# Patient Record
Sex: Male | Born: 1990 | Race: Black or African American | Hispanic: No | Marital: Single | Smoking: Current every day smoker
Health system: Southern US, Community
[De-identification: ages and names within clinical notes are randomized; demographics above are authoritative.]

## PROBLEM LIST (undated history)

## (undated) DIAGNOSIS — T401X1A Poisoning by heroin, accidental (unintentional), initial encounter: Secondary | ICD-10-CM

## (undated) DIAGNOSIS — F1910 Other psychoactive substance abuse, uncomplicated: Secondary | ICD-10-CM

---
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
## 2014-03-26 ENCOUNTER — Encounter (HOSPITAL_COMMUNITY): Payer: Self-pay | Admitting: Emergency Medicine

## 2014-03-26 ENCOUNTER — Emergency Department (HOSPITAL_COMMUNITY): Payer: Self-pay

## 2014-03-26 ENCOUNTER — Emergency Department (HOSPITAL_COMMUNITY)
Admission: EM | Admit: 2014-03-26 | Discharge: 2014-03-26 | Disposition: A | Discharge status: Home / Self Care | Payer: Self-pay | Attending: Emergency Medicine | Admitting: Emergency Medicine

## 2014-03-26 DIAGNOSIS — F17200 Nicotine dependence, unspecified, uncomplicated: Secondary | ICD-10-CM | POA: Insufficient documentation

## 2014-03-26 DIAGNOSIS — J209 Acute bronchitis, unspecified: Secondary | ICD-10-CM | POA: Insufficient documentation

## 2014-03-26 DIAGNOSIS — J40 Bronchitis, not specified as acute or chronic: Secondary | ICD-10-CM

## 2014-03-26 DIAGNOSIS — Z792 Long term (current) use of antibiotics: Secondary | ICD-10-CM | POA: Insufficient documentation

## 2014-03-26 LAB — URINE MICROSCOPIC-ADD ON

## 2014-03-26 LAB — URINALYSIS, ROUTINE W REFLEX MICROSCOPIC
Glucose, UA: NEGATIVE mg/dL
Hgb urine dipstick: NEGATIVE
LEUKOCYTES UA: NEGATIVE
NITRITE: NEGATIVE
Protein, ur: 30 mg/dL — AB
SPECIFIC GRAVITY, URINE: 1.025 (ref 1.005–1.030)
Urobilinogen, UA: 1 mg/dL (ref 0.0–1.0)
pH: 7 (ref 5.0–8.0)

## 2014-03-26 LAB — CBC WITH DIFFERENTIAL/PLATELET
BASOS ABS: 0 10*3/uL (ref 0.0–0.1)
BASOS PCT: 0 % (ref 0–1)
EOS PCT: 0 % (ref 0–5)
Eosinophils Absolute: 0 10*3/uL (ref 0.0–0.7)
HEMATOCRIT: 42.1 % (ref 39.0–52.0)
Hemoglobin: 14.5 g/dL (ref 13.0–17.0)
Lymphocytes Relative: 6 % — ABNORMAL LOW (ref 12–46)
Lymphs Abs: 1.2 10*3/uL (ref 0.7–4.0)
MCH: 30.3 pg (ref 26.0–34.0)
MCHC: 34.4 g/dL (ref 30.0–36.0)
MCV: 88.1 fL (ref 78.0–100.0)
Monocytes Absolute: 1.3 10*3/uL — ABNORMAL HIGH (ref 0.1–1.0)
Monocytes Relative: 7 % (ref 3–12)
NEUTROS ABS: 17.2 10*3/uL — AB (ref 1.7–7.7)
Neutrophils Relative %: 87 % — ABNORMAL HIGH (ref 43–77)
Platelets: 156 10*3/uL (ref 150–400)
RBC: 4.78 MIL/uL (ref 4.22–5.81)
RDW: 12.2 % (ref 11.5–15.5)
WBC: 19.8 10*3/uL — ABNORMAL HIGH (ref 4.0–10.5)

## 2014-03-26 LAB — COMPREHENSIVE METABOLIC PANEL
ALT: 13 U/L (ref 0–53)
AST: 21 U/L (ref 0–37)
Albumin: 4.1 g/dL (ref 3.5–5.2)
Alkaline Phosphatase: 65 U/L (ref 39–117)
BUN: 11 mg/dL (ref 6–23)
CALCIUM: 9.7 mg/dL (ref 8.4–10.5)
CHLORIDE: 100 meq/L (ref 96–112)
CO2: 22 mEq/L (ref 19–32)
CREATININE: 1 mg/dL (ref 0.50–1.35)
GFR calc Af Amer: >90 mL/min (ref >90)
GFR calc non Af Amer: >90 mL/min (ref >90)
Glucose, Bld: 113 mg/dL — ABNORMAL HIGH (ref 70–99)
Potassium: 3.7 mEq/L (ref 3.7–5.3)
Sodium: 137 mEq/L (ref 137–147)
TOTAL PROTEIN: 7.9 g/dL (ref 6.0–8.3)
Total Bilirubin: 0.7 mg/dL (ref 0.3–1.2)

## 2014-03-26 LAB — LIPASE, BLOOD: Lipase: 10 U/L — ABNORMAL LOW (ref 11–59)

## 2014-03-26 LAB — RAPID STREP SCREEN (MED CTR MEBANE ONLY): STREPTOCOCCUS, GROUP A SCREEN (DIRECT): NEGATIVE

## 2014-03-26 MED ORDER — AZITHROMYCIN 250 MG PO TABS: ORAL_TABLET | ORAL | Status: AC

## 2014-03-26 MED ORDER — ONDANSETRON HCL 4 MG/2ML IJ SOLN
4.0000 mg | Freq: Once | INTRAMUSCULAR | Status: AC
  Administered 2014-03-26: 4 mg via INTRAVENOUS
  Filled 2014-03-26: qty 2

## 2014-03-26 MED ORDER — PROMETHAZINE HCL 25 MG PO TABS: 25.0000 mg | ORAL_TABLET | Freq: Four times a day (QID) | ORAL | Status: AC | PRN

## 2014-03-26 MED ORDER — KETOROLAC TROMETHAMINE 30 MG/ML IJ SOLN
30.0000 mg | Freq: Once | INTRAMUSCULAR | Status: AC
  Administered 2014-03-26: 30 mg via INTRAVENOUS
  Filled 2014-03-26: qty 1

## 2014-03-26 MED ORDER — SODIUM CHLORIDE 0.9 % IV BOLUS (SEPSIS)
1000.0000 mL | Freq: Once | INTRAVENOUS | Status: AC
  Administered 2014-03-26: 1000 mL via INTRAVENOUS

## 2014-03-26 MED ORDER — IBUPROFEN 800 MG PO TABS: 800.0000 mg | ORAL_TABLET | Freq: Three times a day (TID) | ORAL | Status: AC | PRN

## 2014-03-26 NOTE — ED Notes (Signed)
Pt provided with warm blankets.

## 2014-03-26 NOTE — ED Provider Notes (Signed)
CSN: [id number]     Arrival date & time 03/26/14  1317 History   First MD Initiated Contact with Patient 03/26/14 1459     Chief Complaint  Patient presents with  . Nausea  . Emesis     (Consider location/radiation/quality/duration/timing/severity/associated sxs/prior Treatment) Patient is a 23 y.o. male presenting with vomiting. The history is provided by the patient (the pt complains of cogh and vomiting).  Emesis Severity:  Moderate Timing:  Intermittent Able to tolerate:  Liquids Progression:  Improving Chronicity:  New Context: post-tussive   Relieved by:  Nothing Associated symptoms: no abdominal pain, no diarrhea and no headaches     History reviewed. No pertinent past medical history. History reviewed. No pertinent past surgical history. History reviewed. No pertinent family history. History  Substance Use Topics  . Smoking status: Current Every Day Smoker -- 1.00 packs/day    Types: Cigarettes  . Smokeless tobacco: Not on file  . Alcohol Use: No    Review of Systems  Constitutional: Negative for appetite change and fatigue.  HENT: Negative for congestion, ear discharge and sinus pressure.   Eyes: Negative for discharge.  Respiratory: Positive for cough.   Cardiovascular: Negative for chest pain.  Gastrointestinal: Positive for vomiting. Negative for abdominal pain and diarrhea.  Genitourinary: Negative for frequency and hematuria.  Musculoskeletal: Negative for back pain.  Skin: Negative for rash.  Neurological: Negative for seizures and headaches.  Psychiatric/Behavioral: Negative for hallucinations.      Allergies  Aspirin  Home Medications   Current Outpatient Rx  Name  Route  Sig  Dispense  Refill  . OVER THE COUNTER MEDICATION   Oral   Take 2 tablets by mouth every 4 (four) hours as needed (cold symptoms). OTC cold and flu medication.         Marland Kitchen. azithromycin (ZITHROMAX Z-PAK) 250 MG tablet      2 po day one, then 1 daily x 4 days   5  tablet   0   . ibuprofen (ADVIL,MOTRIN) 800 MG tablet   Oral   Take 1 tablet (800 mg total) by mouth every 8 (eight) hours as needed for fever or mild pain.   15 tablet   0   . promethazine (PHENERGAN) 25 MG tablet   Oral   Take 1 tablet (25 mg total) by mouth every 6 (six) hours as needed for nausea or vomiting.   15 tablet   0    BP 116/75  Pulse 94  Temp(Src) 98.7 F (37.1 C) (Rectal)  Resp 17  Wt 200 lb (90.719 kg)  SpO2 98% Physical Exam  Constitutional: He is oriented to person, place, and time. He appears well-developed.  HENT:  Head: Normocephalic.  Eyes: Conjunctivae and EOM are normal. No scleral icterus.  Neck: Neck supple. No thyromegaly present.  Cardiovascular: Normal rate and regular rhythm.  Exam reveals no gallop and no friction rub.   No murmur heard. Pulmonary/Chest: No stridor. He has no wheezes. He has no rales. He exhibits no tenderness.  Abdominal: He exhibits no distension. There is no tenderness. There is no rebound.  Musculoskeletal: Normal range of motion. He exhibits no edema.  Lymphadenopathy:    He has no cervical adenopathy.  Neurological: He is oriented to person, place, and time. He exhibits normal muscle tone. Coordination normal.  Skin: No rash noted. No erythema.  Psychiatric: He has a normal mood and affect. His behavior is normal.    ED Course  Procedures (including critical care time)  Labs Review Labs Reviewed  CBC WITH DIFFERENTIAL - Abnormal; Notable for the following:    WBC 19.8 (*)    Neutrophils Relative % 87 (*)    Neutro Abs 17.2 (*)    Lymphocytes Relative 6 (*)    Monocytes Absolute 1.3 (*)    All other components within normal limits  COMPREHENSIVE METABOLIC PANEL - Abnormal; Notable for the following:    Glucose, Bld 113 (*)    All other components within normal limits  LIPASE, BLOOD - Abnormal; Notable for the following:    Lipase 10 (*)    All other components within normal limits  URINALYSIS, ROUTINE W  REFLEX MICROSCOPIC - Abnormal; Notable for the following:    Bilirubin Urine SMALL (*)    Ketones, ur TRACE (*)    Protein, ur 30 (*)    All other components within normal limits  RAPID STREP SCREEN  CULTURE, GROUP A STREP  URINE MICROSCOPIC-ADD ON   Imaging Review Dg Chest 2 View  03/26/2014   CLINICAL DATA:  Fever and chills, history smoking  EXAM: CHEST  2 VIEW  COMPARISON:  None  FINDINGS: Upper-normal size of cardiac silhouette.  Mediastinal contours and pulmonary vascularity normal.  Lungs clear.  Mild central peribronchial thickening.  No pleural effusion or pneumothorax.  Bones unremarkable.  IMPRESSION: Bronchitic changes without infiltrate.   Electronically Signed   By: Ulyses Southward M.D.   On: 03/26/2014 18:27     EKG Interpretation None      MDM   Final diagnoses:  Bronchitis        Benny Lennert, MD 03/26/14 [phone]

## 2014-03-26 NOTE — ED Notes (Signed)
Pt arrives with c/o of flu like symptoms, nausea, vomiting, diarrhea, hot to the touch for the last day. Attempted to use tylenol this morning but vomiting it back up.

## 2014-03-26 NOTE — ED Notes (Signed)
Patient transported to X-ray 

## 2014-03-26 NOTE — Discharge Instructions (Signed)
Drink plenty of fluids. And follow up in 2-3 days if not improving.

## 2014-03-26 NOTE — ED Notes (Signed)
Pt requesting water and food.  NAD noted.

## 2014-03-26 NOTE — ED Notes (Signed)
Family at bedside. Called to room. Patient states that he just vomited and he has not eaten all day.

## 2014-03-28 LAB — CULTURE, GROUP A STREP

## 2019-10-27 ENCOUNTER — Other Ambulatory Visit: Payer: Self-pay

## 2019-10-27 ENCOUNTER — Emergency Department (HOSPITAL_COMMUNITY): Admission: EM | Admit: 2019-10-27 | Discharge: 2019-10-27 | Discharge status: Against Medical Advice | Payer: Self-pay

## 2020-04-11 ENCOUNTER — Other Ambulatory Visit: Payer: Self-pay

## 2020-04-11 ENCOUNTER — Ambulatory Visit: Payer: Self-pay | Attending: Internal Medicine

## 2020-04-11 DIAGNOSIS — Z20822 Contact with and (suspected) exposure to covid-19: Secondary | ICD-10-CM | POA: Insufficient documentation

## 2020-04-12 LAB — NOVEL CORONAVIRUS, NAA: SARS-CoV-2, NAA: NOT DETECTED

## 2020-04-12 LAB — SARS-COV-2, NAA 2 DAY TAT

## 2021-03-05 ENCOUNTER — Encounter (HOSPITAL_COMMUNITY): Payer: Self-pay

## 2021-03-05 ENCOUNTER — Emergency Department (HOSPITAL_COMMUNITY)
Admission: EM | Admit: 2021-03-05 | Discharge: 2021-03-05 | Disposition: A | Discharge status: Home / Self Care | Payer: Self-pay | Attending: Emergency Medicine | Admitting: Emergency Medicine

## 2021-03-05 ENCOUNTER — Other Ambulatory Visit: Payer: Self-pay

## 2021-03-05 DIAGNOSIS — F17210 Nicotine dependence, cigarettes, uncomplicated: Secondary | ICD-10-CM | POA: Insufficient documentation

## 2021-03-05 DIAGNOSIS — R030 Elevated blood-pressure reading, without diagnosis of hypertension: Secondary | ICD-10-CM | POA: Insufficient documentation

## 2021-03-05 NOTE — Discharge Instructions (Addendum)
Please read the attachments.  You have an elevated blood pressure reading here in the ED.  However, we need to first connect lifestyle modifications to see if we can improve your blood pressure without medications.  I recommend regular exercise 4-5 times per week, low-salt diet, and most importantly, tobacco cessation.    It is vitally important to get established with a primary care provider as soon as possible for ongoing evaluation and management of your health and wellbeing.  It is important to have continuity of care with a single provider.  Please return to the ED or seek immediate medical attention should you experience any new or worsening symptoms.

## 2021-03-05 NOTE — ED Provider Notes (Signed)
[facility] EMERGENCY DEPARTMENT Provider Note   CSN: [id number] Arrival date & time: 03/05/21  1554     History Chief Complaint  Patient presents with  . Hypertension    Eddie Carrillo is a 30 y.o. male with no relevant past medical history who comes the ED with complaints of high blood pressures.    On my examination, patient reports that this morning he woke up and felt as though he had some mildly blurred vision/saw spots for approximately 10 minutes before it spontaneously resolved.  He states that he eats mustard which made it better.  His wife states that she suspected to be related to his blood pressure.  He then went to his place of employment and inform them of his symptoms.  They also advised him to come the ED for evaluation with concern for high blood pressure.  He is not endorsing any symptoms at this time.  His visual acuity is at baseline.  Denies any chest pain, shortness of breath, headache, back pain, nausea or emesis, or other symptoms.  Patient states that he has recently been released from being incarcerated and has not yet established care with a primary care provider.  However, he suspects that he may be able to obtain medical insurance through his employer.    Patient admits to smoking 1 pack of menthol cigarettes daily x1 month.  Previously he had been smoking Black & Mild cigars.    HPI     History reviewed. No pertinent past medical history.  There are no problems to display for this patient.   History reviewed. No pertinent surgical history.     History reviewed. No pertinent family history.  Social History   Tobacco Use  . Smoking status: Current Every Day Smoker    Packs/day: 1.00    Types: Cigarettes  . Smokeless tobacco: Never Used  Vaping Use  . Vaping Use: Never used  Substance Use Topics  . Alcohol use: Yes  . Drug use: Not Currently    Types: Marijuana    Home Medications Prior to Admission medications   Medication Sig  Start Date End Date Taking? Authorizing Provider  azithromycin (ZITHROMAX Z-PAK) 250 MG tablet 2 po day one, then 1 daily x 4 days 03/26/14   Bethann Berkshire, MD  ibuprofen (ADVIL,MOTRIN) 800 MG tablet Take 1 tablet (800 mg total) by mouth every 8 (eight) hours as needed for fever or mild pain. 03/26/14   Bethann Berkshire, MD  OVER THE COUNTER MEDICATION Take 2 tablets by mouth every 4 (four) hours as needed (cold symptoms). OTC cold and flu medication.    [provider]  promethazine (PHENERGAN) 25 MG tablet Take 1 tablet (25 mg total) by mouth every 6 (six) hours as needed for nausea or vomiting. 03/26/14   Bethann Berkshire, MD    Allergies    Aspirin  Review of Systems   Review of Systems  All other systems reviewed and are negative.   Physical Exam Updated Vital Signs BP (!) 156/95 (BP Location: Right Arm)   Pulse 80   Temp 98.4 F (36.9 C) (Oral)   Resp 18   Ht 5\' 10"  (1.778 m)   Wt 90.7 kg   SpO2 99%   BMI 28.70 kg/m   Physical Exam Vitals and nursing note reviewed. Exam conducted with a chaperone present.  Constitutional:      General: He is not in acute distress.    Appearance: Normal appearance. He is not ill-appearing or toxic-appearing.  HENT:     Head: Normocephalic and atraumatic.  Eyes:     General: No scleral icterus.    Extraocular Movements: Extraocular movements intact.     Conjunctiva/sclera: Conjunctivae normal.     Pupils: Pupils are equal, round, and reactive to light.  Cardiovascular:     Rate and Rhythm: Normal rate and regular rhythm.     Pulses: Normal pulses.     Heart sounds: Normal heart sounds.  Pulmonary:     Effort: Pulmonary effort is normal. No respiratory distress.     Breath sounds: Normal breath sounds. No wheezing or rales.  Musculoskeletal:        General: Normal range of motion.     Cervical back: Normal range of motion.  Skin:    General: Skin is dry.  Neurological:     General: No focal deficit present.     Mental Status:  He is alert and oriented to person, place, and time.     GCS: GCS eye subscore is 4. GCS verbal subscore is 5. GCS motor subscore is 6.     Cranial Nerves: No cranial nerve deficit.  Psychiatric:        Mood and Affect: Mood normal.        Behavior: Behavior normal.        Thought Content: Thought content normal.     ED Results / Procedures / Treatments   Labs (all labs ordered are listed, but only abnormal results are displayed) Labs Reviewed - No data to display  EKG None  Radiology No results found.  Procedures Procedures   Medications Ordered in ED Medications - No data to display  ED Course  I have reviewed the triage vital signs and the nursing notes.  Pertinent labs & imaging results that were available during my care of the patient were reviewed by me and considered in my medical decision making (see chart for details).    MDM Rules/Calculators/A&P                          Eddie Carrillo was evaluated in Emergency Department on 03/05/2021 for the symptoms described in the history of present illness. He was evaluated in the context of the global COVID-19 pandemic, which necessitated consideration that the patient might be at risk for infection with the SARS-CoV-2 virus that causes COVID-19. Institutional protocols and algorithms that pertain to the evaluation of patients at risk for COVID-19 are in a state of rapid change based on information released by regulatory bodies including the CDC and federal and state organizations. These policies and algorithms were followed during the patient's care in the ED.  I personally reviewed patient's medical chart and all notes from triage and staff during today's encounter. I have also ordered and reviewed all labs and imaging that I felt to be medically necessary in the evaluation of this patient's complaints and with consideration of their physical exam. If needed, translation services were available and utilized.   Patient  with mildly elevated blood pressure here in the ED at 156/95.  He states that he was minimally symptomatic with visual changes earlier this morning, but that it subsided.  No symptoms at present.  He is requesting work note.    Emphasized the importance of close outpatient follow-up given his elevated blood pressure.  I cautioned him on the long term consequences from untreated hypertension.    While patient has elevated BP here in the ED, I  have lower suspicion for encephalopathy, stroke, aortic dissection or ACS, CVA, renal failure, or eclampsia.  Patient's physical exam is benign and I do not feel as though further work-up is warranted.  Low suspicion for endorgan injury.  We will encourage regular exercise 4-5 times per week and healthy low-salt diet.  I also strongly advised that he discontinue smoking tobacco.  ED return precautions discussed.  Patient voices understanding is agreeable to the plan.  The patient was counseled on the dangers of tobacco use, and was advised to quit.  Reviewed strategies to maximize success, including removing cigarettes and smoking materials from environment, stress management, substitution of other forms of reinforcement, support of family/friends and written materials. Total time was 5 min CPT code 62229.    Final Clinical Impression(s) / ED Diagnoses Final diagnoses:  Blood pressure elevated without history of HTN    Rx / DC Orders ED Discharge Orders    None       Lorelee New, PA-C 03/05/21 1643    Vanetta Mulders, MD 03/12/21 [phone]

## 2021-03-05 NOTE — ED Triage Notes (Signed)
Pt to er, pt states that he is here for htn, states that he has seen his md before and told that he was pre hypertensive, states that he is not currently on any medications for his blood pressure, states that he is here today because he started seeing spots in his vision and was told that this ment his blood pressure was high. Denies chest pain, denies weakness, denies confusion

## 2021-03-17 ENCOUNTER — Other Ambulatory Visit: Payer: Self-pay | Admitting: Nurse Practitioner

## 2021-04-02 ENCOUNTER — Ambulatory Visit: Payer: Self-pay | Admitting: Nurse Practitioner

## 2021-06-04 ENCOUNTER — Emergency Department (HOSPITAL_COMMUNITY)
Admission: EM | Admit: 2021-06-04 | Discharge: 2021-06-04 | Disposition: A | Discharge status: Home / Self Care | Payer: Self-pay | Attending: Emergency Medicine | Admitting: Emergency Medicine

## 2021-06-04 ENCOUNTER — Encounter (HOSPITAL_COMMUNITY): Payer: Self-pay | Admitting: Pharmacy Technician

## 2021-06-04 DIAGNOSIS — R1084 Generalized abdominal pain: Secondary | ICD-10-CM | POA: Insufficient documentation

## 2021-06-04 DIAGNOSIS — R1110 Vomiting, unspecified: Secondary | ICD-10-CM | POA: Insufficient documentation

## 2021-06-04 DIAGNOSIS — F17210 Nicotine dependence, cigarettes, uncomplicated: Secondary | ICD-10-CM | POA: Insufficient documentation

## 2021-06-04 LAB — COMPREHENSIVE METABOLIC PANEL
ALT: 17 U/L (ref 0–44)
AST: 28 U/L (ref 15–41)
Albumin: 4.5 g/dL (ref 3.5–5.0)
Alkaline Phosphatase: 50 U/L (ref 38–126)
Anion gap: 15 (ref 5–15)
BUN: 11 mg/dL (ref 6–20)
CO2: 19 mmol/L — ABNORMAL LOW (ref 22–32)
Calcium: 9.7 mg/dL (ref 8.9–10.3)
Chloride: 104 mmol/L (ref 98–111)
Creatinine, Ser: 1.2 mg/dL (ref 0.61–1.24)
GFR, Estimated: >60 mL/min (ref >60)
Glucose, Bld: 127 mg/dL — ABNORMAL HIGH (ref 70–99)
Potassium: 3.3 mmol/L — ABNORMAL LOW (ref 3.5–5.1)
Sodium: 138 mmol/L (ref 135–145)
Total Bilirubin: 1 mg/dL (ref 0.3–1.2)
Total Protein: 7.5 g/dL (ref 6.5–8.1)

## 2021-06-04 LAB — LIPASE, BLOOD: Lipase: 23 U/L (ref 11–51)

## 2021-06-04 LAB — CBC WITH DIFFERENTIAL/PLATELET
Abs Immature Granulocytes: 0.06 10*3/uL (ref 0.00–0.07)
Basophils Absolute: 0 10*3/uL (ref 0.0–0.1)
Basophils Relative: 0 %
Eosinophils Absolute: 0 10*3/uL (ref 0.0–0.5)
Eosinophils Relative: 0 %
HCT: 46.3 % (ref 39.0–52.0)
Hemoglobin: 16.1 g/dL (ref 13.0–17.0)
Immature Granulocytes: 0 %
Lymphocytes Relative: 13 %
Lymphs Abs: 1.8 10*3/uL (ref 0.7–4.0)
MCH: 31.3 pg (ref 26.0–34.0)
MCHC: 34.8 g/dL (ref 30.0–36.0)
MCV: 90.1 fL (ref 80.0–100.0)
Monocytes Absolute: 0.6 10*3/uL (ref 0.1–1.0)
Monocytes Relative: 4 %
Neutro Abs: 11 10*3/uL — ABNORMAL HIGH (ref 1.7–7.7)
Neutrophils Relative %: 83 %
Platelets: 206 10*3/uL (ref 150–400)
RBC: 5.14 MIL/uL (ref 4.22–5.81)
RDW: 12.5 % (ref 11.5–15.5)
WBC: 13.5 10*3/uL — ABNORMAL HIGH (ref 4.0–10.5)
nRBC: 0 % (ref 0.0–0.2)

## 2021-06-04 LAB — URINALYSIS, ROUTINE W REFLEX MICROSCOPIC
Bilirubin Urine: NEGATIVE
Glucose, UA: NEGATIVE mg/dL
Hgb urine dipstick: NEGATIVE
Ketones, ur: 5 mg/dL — AB
Nitrite: NEGATIVE
Protein, ur: 100 mg/dL — AB
Specific Gravity, Urine: 1.036 — ABNORMAL HIGH (ref 1.005–1.030)
pH: 6 (ref 5.0–8.0)

## 2021-06-04 MED ORDER — METOCLOPRAMIDE HCL 10 MG PO TABS: 10.0000 mg | ORAL_TABLET | Freq: Three times a day (TID) | ORAL | 0 refills | Status: DC

## 2021-06-04 MED ORDER — CAPSAICIN 0.025 % EX CREA
TOPICAL_CREAM | Freq: Once | CUTANEOUS | Status: DC
  Filled 2021-06-04: qty 60

## 2021-06-04 MED ORDER — METOCLOPRAMIDE HCL 10 MG PO TABS
10.0000 mg | ORAL_TABLET | Freq: Three times a day (TID) | ORAL | 0 refills | Status: AC
Start: 2021-06-04 — End: 2022-06-04

## 2021-06-04 MED ORDER — HALOPERIDOL LACTATE 5 MG/ML IJ SOLN
2.0000 mg | Freq: Once | INTRAMUSCULAR | Status: AC
  Administered 2021-06-04: 2 mg via INTRAVENOUS
  Filled 2021-06-04: qty 1

## 2021-06-04 MED ORDER — SODIUM CHLORIDE 0.9 % IV BOLUS
1000.0000 mL | Freq: Once | INTRAVENOUS | Status: AC
  Administered 2021-06-04: 1000 mL via INTRAVENOUS

## 2021-06-04 MED ORDER — ONDANSETRON 4 MG PO TBDP
8.0000 mg | ORAL_TABLET | Freq: Once | ORAL | Status: AC
  Administered 2021-06-04: 8 mg via ORAL
  Filled 2021-06-04: qty 2

## 2021-06-04 NOTE — ED Provider Notes (Signed)
[facility] EMERGENCY DEPARTMENT Provider Note   CSN: [id number] Arrival date & time: 06/04/21  1109     History Chief Complaint  Patient presents with   Abdominal Pain   Emesis    Eddie Carrillo is a 30 y.o. male.  The history is provided by the patient. No language interpreter was used.  Abdominal Pain Pain location:  Generalized Pain quality: aching   Pain radiates to:  Does not radiate Pain severity:  Moderate Onset quality:  Gradual Timing:  Constant Progression:  Worsening Chronicity:  New Context comment:  Marijuana use Relieved by:  Nothing Worsened by:  Nothing Ineffective treatments:  None tried Associated symptoms: vomiting   Emesis Severity:  Moderate Timing:  Constant Progression:  Worsening Recent urination:  Normal Relieved by:  Nothing Worsened by:  Nothing Associated symptoms: abdominal pain   Risk factors: no suspect food intake       History reviewed. No pertinent past medical history.  There are no problems to display for this patient.   History reviewed. No pertinent surgical history.     No family history on file.  Social History   Tobacco Use   Smoking status: Every Day    Packs/day: 1.00    Pack years: 0.00    Types: Cigarettes   Smokeless tobacco: Never  Vaping Use   Vaping Use: Never used  Substance Use Topics   Alcohol use: Yes   Drug use: Not Currently    Types: Marijuana    Home Medications Prior to Admission medications   Medication Sig Start Date End Date Taking? Authorizing Provider  azithromycin (ZITHROMAX Z-PAK) 250 MG tablet 2 po day one, then 1 daily x 4 days 03/26/14   Bethann Berkshire, MD  ibuprofen (ADVIL,MOTRIN) 800 MG tablet Take 1 tablet (800 mg total) by mouth every 8 (eight) hours as needed for fever or mild pain. 03/26/14   Bethann Berkshire, MD  OVER THE COUNTER MEDICATION Take 2 tablets by mouth every 4 (four) hours as needed (cold symptoms). OTC cold and flu medication.     [provider]  promethazine (PHENERGAN) 25 MG tablet Take 1 tablet (25 mg total) by mouth every 6 (six) hours as needed for nausea or vomiting. 03/26/14   Bethann Berkshire, MD    Allergies    Aspirin  Review of Systems   Review of Systems  Gastrointestinal:  Positive for abdominal pain and vomiting.  All other systems reviewed and are negative.  Physical Exam Updated Vital Signs BP (!) 146/113 (BP Location: Left Arm)   Pulse 63   Temp 98.2 F (36.8 C) (Oral)   Resp 18   SpO2 100%   Physical Exam Vitals and nursing note reviewed.  Constitutional:      Appearance: He is well-developed.  HENT:     Head: Normocephalic and atraumatic.  Eyes:     Conjunctiva/sclera: Conjunctivae normal.  Cardiovascular:     Rate and Rhythm: Normal rate and regular rhythm.     Heart sounds: No murmur heard. Pulmonary:     Effort: Pulmonary effort is normal. No respiratory distress.     Breath sounds: Normal breath sounds.  Abdominal:     Palpations: Abdomen is soft.     Tenderness: There is no abdominal tenderness.  Musculoskeletal:     Cervical back: Neck supple.  Skin:    General: Skin is warm and dry.  Neurological:     Mental Status: He is alert.    ED Results /  Procedures / Treatments   Labs (all labs ordered are listed, but only abnormal results are displayed) Labs Reviewed  CBC WITH DIFFERENTIAL/PLATELET - Abnormal; Notable for the following components:      Result Value   WBC 13.5 (*)    Neutro Abs 11.0 (*)    All other components within normal limits  COMPREHENSIVE METABOLIC PANEL - Abnormal; Notable for the following components:   Potassium 3.3 (*)    CO2 19 (*)    Glucose, Bld 127 (*)    All other components within normal limits  URINALYSIS, ROUTINE W REFLEX MICROSCOPIC - Abnormal; Notable for the following components:   Color, Urine AMBER (*)    APPearance CLOUDY (*)    Specific Gravity, Urine 1.036 (*)    Ketones, ur 5 (*)    Protein, ur 100 (*)     Leukocytes,Ua SMALL (*)    Bacteria, UA MANY (*)    All other components within normal limits  LIPASE, BLOOD  RAPID URINE DRUG SCREEN, HOSP PERFORMED    EKG None  Radiology No results found.  Procedures Procedures   Medications Ordered in ED Medications  capsaicin (ZOSTRIX) 0.025 % cream (has no administration in time range)  ondansetron (ZOFRAN-ODT) disintegrating tablet 8 mg (8 mg Oral Given 06/04/21 1142)  haloperidol lactate (HALDOL) injection 2 mg (2 mg Intravenous Given 06/04/21 1615)  sodium chloride 0.9 % bolus 1,000 mL (1,000 mLs Intravenous New Bag/Given 06/04/21 1616)    ED Course  I have reviewed the triage vital signs and the nursing notes.  Pertinent labs & imaging results that were available during my care of the patient were reviewed by me and considered in my medical decision making (see chart for details).    MDM Rules/Calculators/A&P                          MDM:  Pt given Iv fluids x 1 liter.  Haldol 2mg  IV.  Pt reports relief from symptoms.  Pt advised to avoid THC use.   Final Clinical Impression(s) / ED Diagnoses Final diagnoses:  Non-intractable vomiting, presence of nausea not specified, unspecified vomiting type    Rx / DC Orders ED Discharge Orders     None     An After Visit Summary was printed and given to the patient.    , Elson Areas 06/04/21 1752    06/06/21, MD 06/05/21 1539

## 2021-06-04 NOTE — ED Triage Notes (Signed)
Pt here with emesis, abd pain since late yesterday. Pt does endorse marijuana use yesterday. Pt also c/o some shob today. Speaking in complete sentences.

## 2021-06-04 NOTE — ED Provider Notes (Signed)
Emergency Medicine Provider Triage Evaluation Note  Eddie Carrillo , a 30 y.o. male  was evaluated in triage.  Pt complains of abdominal pain and shortness of breath with vomiting onset this morning.  Denies alcohol use, does report smoking marijuana.  Denies constipation or diarrhea.  Reports 7 episodes of nonbloody emesis and feels severely dehydrated..  Review of Systems  Positive: Abdominal pain, vomiting, shortness of breath Negative: Chest pain, diarrhea  Physical Exam  BP (!) 162/100 (BP Location: Left Arm)   Pulse 98   Temp 98.2 F (36.8 C) (Oral)   Resp 18   SpO2 100%  Gen:   Awake, no distress   Resp:  Normal effort  MSK:   Moves extremities without difficulty  Other:  Lungs CTA, very anxious  Medical Decision Making  Medically screening exam initiated at 11:37 AM.  Appropriate orders placed.  Eddie Carrillo was informed that the remainder of the evaluation will be completed by another provider, this initial triage assessment does not replace that evaluation, and the importance of remaining in the ED until their evaluation is complete.     Jeannie Fend, PA-C 06/04/21 1138    Arby Barrette, MD 06/15/21 1239

## 2021-11-02 ENCOUNTER — Encounter (HOSPITAL_COMMUNITY): Payer: Self-pay | Admitting: Emergency Medicine

## 2021-11-02 ENCOUNTER — Emergency Department (HOSPITAL_COMMUNITY)
Admission: EM | Admit: 2021-11-02 | Discharge: 2021-11-03 | Disposition: A | Discharge status: Home / Self Care | Payer: Self-pay | Attending: Emergency Medicine | Admitting: Emergency Medicine

## 2021-11-02 ENCOUNTER — Emergency Department (HOSPITAL_COMMUNITY): Payer: Self-pay

## 2021-11-02 DIAGNOSIS — T50901A Poisoning by unspecified drugs, medicaments and biological substances, accidental (unintentional), initial encounter: Secondary | ICD-10-CM | POA: Insufficient documentation

## 2021-11-02 DIAGNOSIS — R109 Unspecified abdominal pain: Secondary | ICD-10-CM | POA: Insufficient documentation

## 2021-11-02 DIAGNOSIS — R112 Nausea with vomiting, unspecified: Secondary | ICD-10-CM

## 2021-11-02 DIAGNOSIS — F17210 Nicotine dependence, cigarettes, uncomplicated: Secondary | ICD-10-CM | POA: Insufficient documentation

## 2021-11-02 DIAGNOSIS — R52 Pain, unspecified: Secondary | ICD-10-CM

## 2021-11-02 DIAGNOSIS — X58XXXA Exposure to other specified factors, initial encounter: Secondary | ICD-10-CM | POA: Insufficient documentation

## 2021-11-02 DIAGNOSIS — R0689 Other abnormalities of breathing: Secondary | ICD-10-CM | POA: Insufficient documentation

## 2021-11-02 LAB — CBC
HCT: 46.1 % (ref 39.0–52.0)
Hemoglobin: 15.8 g/dL (ref 13.0–17.0)
MCH: 30.9 pg (ref 26.0–34.0)
MCHC: 34.3 g/dL (ref 30.0–36.0)
MCV: 90 fL (ref 80.0–100.0)
Platelets: 165 10*3/uL (ref 150–400)
RBC: 5.12 MIL/uL (ref 4.22–5.81)
RDW: 12.4 % (ref 11.5–15.5)
WBC: 22.7 10*3/uL — ABNORMAL HIGH (ref 4.0–10.5)
nRBC: 0 % (ref 0.0–0.2)

## 2021-11-02 MED ORDER — SODIUM CHLORIDE 0.9 % IV BOLUS
1000.0000 mL | Freq: Once | INTRAVENOUS | Status: AC
  Administered 2021-11-02: 1000 mL via INTRAVENOUS

## 2021-11-02 MED ORDER — LORAZEPAM 2 MG/ML IJ SOLN: 1.0000 mg | Freq: Once | INTRAMUSCULAR | Status: AC

## 2021-11-02 MED ORDER — LORAZEPAM 2 MG/ML IJ SOLN
INTRAMUSCULAR | Status: AC
  Administered 2021-11-02: 1 mg via INTRAVENOUS
  Filled 2021-11-02: qty 1
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
## 2021-11-02 NOTE — ED Provider Notes (Signed)
AP-EMERGENCY DEPT [facility] Emergency Department Provider Note MRN:  [id number]  Arrival date & time: 11/03/21     Chief Complaint   Drug Overdose   History of Present Illness   Eddie Carrillo is a 30 y.o. year-old male with a history of hypertension presenting to the ED with chief complaint of drug overdose.  Patient suspects that he was given drugs, explaining that he thinks some people put some drugs in his drink.  He is convinced that he was given heroin and Suboxone.  He is very anxious, mildly altered.  Complaining of pain all over.  Symptoms started about 4 hours ago shortly after drinking this beverage.  I was unable to obtain an accurate HPI, PMH, or ROS due to the patient's altered mental status.  Level 5 caveat.  Review of Systems  Positive for total body pain.  Patient's Health History   History reviewed. No pertinent past medical history.  History reviewed. No pertinent surgical history.  History reviewed. No pertinent family history.  Social History   Socioeconomic History   Marital status: Single    Spouse name: Not on file   Number of children: Not on file   Years of education: Not on file   Highest education level: Not on file  Occupational History   Not on file  Tobacco Use   Smoking status: Every Day    Packs/day: 1.00    Types: Cigarettes   Smokeless tobacco: Never  Vaping Use   Vaping Use: Never used  Substance and Sexual Activity   Alcohol use: Yes   Drug use: Not Currently    Types: Marijuana   Sexual activity: Not on file  Other Topics Concern   Not on file  Social History Narrative   Not on file   Social Determinants of Health   Financial Resource Strain: Not on file  Food Insecurity: Not on file  Transportation Needs: Not on file  Physical Activity: Not on file  Stress: Not on file  Social Connections: Not on file  Intimate Partner Violence: Not on file     Physical Exam   Vitals:   11/03/21 0242 11/03/21 0340   BP: (!) 179/98   Pulse: 87 93  Resp: 16 16  Temp:    SpO2: 96% 96%    CONSTITUTIONAL: Anxious appearing NEURO:  Alert and oriented x 3, no focal deficits, mildly confused EYES:  eyes equal and reactive ENT/NECK:  no LAD, no JVD CARDIO: Tachycardic rate, well-perfused, normal S1 and S2 PULM:  CTAB no wheezing or rhonchi, tachypneic GI/GU:  normal bowel sounds, non-distended, non-tender MSK/SPINE:  No gross deformities, no edema SKIN:  no rash, atraumatic PSYCH: Unable to assess  *Additional and/or pertinent findings included in MDM below  Diagnostic and Interventional Summary    EKG Interpretation  Date/Time:  Sunday November 02 2021 23:40:45 EST Ventricular Rate:  133 PR Interval:  140 QRS Duration: 79 QT Interval:  301 QTC Calculation: 448 R Axis:   -17 Text Interpretation: Sinus tachycardia Ventricular premature complex Aberrant conduction of SV complex(es) Probable left atrial enlargement Borderline left axis deviation Confirmed by Kennis Carina [phone]) on 11/03/2021 12:20:37 AM       Labs Reviewed  CBC - Abnormal; Notable for the following components:      Result Value   WBC 22.7 (*)    All other components within normal limits  COMPREHENSIVE METABOLIC PANEL - Abnormal; Notable for the following components:   Potassium 3.3 (*)    CO2  21 (*)    Glucose, Bld 197 (*)    All other components within normal limits  ACETAMINOPHEN LEVEL - Abnormal; Notable for the following components:   Acetaminophen (Tylenol), Serum <10 (*)    All other components within normal limits  SALICYLATE LEVEL - Abnormal; Notable for the following components:   Salicylate Lvl <7.0 (*)    All other components within normal limits  LIPASE, BLOOD  ETHANOL  RAPID URINE DRUG SCREEN, HOSP PERFORMED  TROPONIN I (HIGH SENSITIVITY)    DG Chest Port 1 View  Final Result    CT HEAD WO CONTRAST ( )    (Results Pending)  CT Angio Chest/Abd/Pel for Dissection W and/or Wo Contrast    (Results  Pending)    Medications  sodium chloride 0.9 % bolus 1,000 mL (0 mLs Intravenous Stopped 11/03/21 0349)  LORazepam (ATIVAN) injection 1 mg (1 mg Intravenous Given 11/02/21 2350)  ondansetron (ZOFRAN) injection 4 mg (4 mg Intravenous Given 11/03/21 0410)  LORazepam (ATIVAN) injection 1 mg (1 mg Intravenous Given 11/03/21 6294)     Procedures  /  Critical Care Procedures  ED Course and Medical Decision Making  I have reviewed the triage vital signs, the nursing notes, and pertinent available records from the EMR.  Listed above are laboratory and imaging tests that I personally ordered, reviewed, and interpreted and then considered in my medical decision making (see below for details).  Patient arrives concerned about drug ingestion.  Denies any intentional use of drugs.  Suspects he was given opioids, however his toxidrome seems more sympathomimetic.  He is tachycardic, tachypneic, seems very anxious, pupils 4 mm.  Providing fluids, IV Ativan and will monitor closely.  He is complaining of total body pain, including chest pain, EKG with sinus tachycardia, no obvious ischemic findings.  Awaiting labs, troponin.     Laboratory evaluation is reassuring, chest x-ray normal.  Vital signs have largely normalized however patient continues to experience pain, loud violent retching.  Continues to complain of pain and feeling like he is going to die.  Unable to control symptoms with Ativan thus far.  Given the concern for possible sympathomimetic toxidrome and the presence of chest/abdominal pain, will obtain CT imaging to exclude aortic dissection, CT scan to exclude intracranial mass or signs of elevated pressure.  Overall favoring more of a functional component or possibly marijuana hyperemesis as patient has had a visit for the same.  Currently exhibiting some somnolence, would consider Haldol for symptom management.  Signed out to oncoming provider at shift change.  Elmer Sow. Pilar Plate, MD [facility]  Emergency Medicine [facility] [email or web address]  Final Clinical Impressions(s) / ED Diagnoses     ICD-10-CM   1. Accidental overdose, initial encounter  T50.901A     2. Nausea and vomiting, unspecified vomiting type  R11.2     3. Total body pain  R52       ED Discharge Orders     None        Discharge Instructions Discussed with and Provided to Patient:   Discharge Instructions   None       Sabas Sous, MD 11/03/21 [phone]

## 2021-11-03 ENCOUNTER — Emergency Department (HOSPITAL_COMMUNITY): Payer: Self-pay

## 2021-11-03 LAB — COMPREHENSIVE METABOLIC PANEL
ALT: 17 U/L (ref 0–44)
AST: 27 U/L (ref 15–41)
Albumin: 4.3 g/dL (ref 3.5–5.0)
Alkaline Phosphatase: 64 U/L (ref 38–126)
Anion gap: 13 (ref 5–15)
BUN: 9 mg/dL (ref 6–20)
CO2: 21 mmol/L — ABNORMAL LOW (ref 22–32)
Calcium: 9.9 mg/dL (ref 8.9–10.3)
Chloride: 102 mmol/L (ref 98–111)
Creatinine, Ser: 1.13 mg/dL (ref 0.61–1.24)
GFR, Estimated: >60 mL/min (ref >60)
Glucose, Bld: 197 mg/dL — ABNORMAL HIGH (ref 70–99)
Potassium: 3.3 mmol/L — ABNORMAL LOW (ref 3.5–5.1)
Sodium: 136 mmol/L (ref 135–145)
Total Bilirubin: 0.6 mg/dL (ref 0.3–1.2)
Total Protein: 7.7 g/dL (ref 6.5–8.1)

## 2021-11-03 LAB — RAPID URINE DRUG SCREEN, HOSP PERFORMED
Amphetamines: NOT DETECTED
Barbiturates: NOT DETECTED
Benzodiazepines: NOT DETECTED
Cocaine: NOT DETECTED
Opiates: NOT DETECTED
Tetrahydrocannabinol: POSITIVE — AB

## 2021-11-03 LAB — SALICYLATE LEVEL: Salicylate Lvl: <7 mg/dL — ABNORMAL LOW (ref 7.0–30.0)

## 2021-11-03 LAB — LIPASE, BLOOD: Lipase: 29 U/L (ref 11–51)

## 2021-11-03 LAB — ETHANOL: Alcohol, Ethyl (B): <10 mg/dL (ref <10)

## 2021-11-03 LAB — ACETAMINOPHEN LEVEL: Acetaminophen (Tylenol), Serum: <10 ug/mL — ABNORMAL LOW (ref 10–30)

## 2021-11-03 LAB — TROPONIN I (HIGH SENSITIVITY): Troponin I (High Sensitivity): 3 ng/L (ref <18)

## 2021-11-03 MED ORDER — ONDANSETRON HCL 4 MG/2ML IJ SOLN
4.0000 mg | Freq: Once | INTRAMUSCULAR | Status: AC
  Administered 2021-11-03: 4 mg via INTRAVENOUS
  Filled 2021-11-03: qty 2

## 2021-11-03 MED ORDER — ONDANSETRON 8 MG PO TBDP: 8.0000 mg | ORAL_TABLET | Freq: Three times a day (TID) | ORAL | 0 refills | Status: AC | PRN

## 2021-11-03 MED ORDER — HALOPERIDOL LACTATE 5 MG/ML IJ SOLN
5.0000 mg | Freq: Once | INTRAMUSCULAR | Status: AC
  Administered 2021-11-03: 5 mg via INTRAVENOUS
  Filled 2021-11-03: qty 1

## 2021-11-03 MED ORDER — IOHEXOL 350 MG/ML SOLN
100.0000 mL | Freq: Once | INTRAVENOUS | Status: AC | PRN
  Administered 2021-11-03: 100 mL via INTRAVENOUS

## 2021-11-03 MED ORDER — LORAZEPAM 2 MG/ML IJ SOLN
1.0000 mg | Freq: Once | INTRAMUSCULAR | Status: AC
  Administered 2021-11-03: 1 mg via INTRAVENOUS
  Filled 2021-11-03: qty 1

## 2021-11-03 NOTE — ED Notes (Signed)
Pt is verbal but did not answer questions correctly. Room and area cleaned from vomit. Pt resting and grabbed his cover to cover himself up. Pt stated, ' Aren't you certified for this?" While nurse was hooking pt up to leads.

## 2021-11-03 NOTE — ED Notes (Signed)
Pt is uncooperative with vs

## 2021-11-03 NOTE — ED Provider Notes (Signed)
Patient was seen by Dr. Pilar Plate.  He had ordered CT scans which are reassuring. Patient also looks better now.  Heart rate is improved.  He received Haldol and feels better after it.  No nausea or vomiting in the last 2+ hours.  Stable for discharge.   Derwood Kaplan, MD 11/03/21 [phone]

## 2021-11-03 NOTE — ED Notes (Signed)
Pt had multiple episodes of emesis. EDP notified. Pt more calm and relaxed at this time.

## 2021-11-04 DIAGNOSIS — M6282 Rhabdomyolysis: Secondary | ICD-10-CM | POA: Insufficient documentation

## 2021-11-04 DIAGNOSIS — B348 Other viral infections of unspecified site: Secondary | ICD-10-CM | POA: Insufficient documentation

## 2021-11-04 DIAGNOSIS — Z720 Tobacco use: Secondary | ICD-10-CM | POA: Insufficient documentation

## 2021-11-04 DIAGNOSIS — E876 Hypokalemia: Secondary | ICD-10-CM | POA: Insufficient documentation

## 2021-11-04 DIAGNOSIS — D72829 Elevated white blood cell count, unspecified: Secondary | ICD-10-CM | POA: Insufficient documentation

## 2021-11-04 DIAGNOSIS — F1190 Opioid use, unspecified, uncomplicated: Secondary | ICD-10-CM | POA: Insufficient documentation

## 2021-11-04 DIAGNOSIS — J210 Acute bronchiolitis due to respiratory syncytial virus: Secondary | ICD-10-CM | POA: Insufficient documentation

## 2021-12-25 ENCOUNTER — Encounter (HOSPITAL_COMMUNITY): Payer: Self-pay | Admitting: Emergency Medicine

## 2021-12-25 ENCOUNTER — Emergency Department (HOSPITAL_COMMUNITY)
Admission: EM | Admit: 2021-12-25 | Discharge: 2021-12-25 | Disposition: A | Discharge status: Home / Self Care | Payer: Self-pay | Attending: Emergency Medicine | Admitting: Emergency Medicine

## 2021-12-25 DIAGNOSIS — T40601A Poisoning by unspecified narcotics, accidental (unintentional), initial encounter: Secondary | ICD-10-CM

## 2021-12-25 DIAGNOSIS — T401X1A Poisoning by heroin, accidental (unintentional), initial encounter: Secondary | ICD-10-CM | POA: Insufficient documentation

## 2021-12-25 HISTORY — DX: Other psychoactive substance abuse, uncomplicated: F19.10

## 2021-12-25 LAB — BASIC METABOLIC PANEL
Anion gap: 18 — ABNORMAL HIGH (ref 5–15)
BUN: 13 mg/dL (ref 6–20)
CO2: 18 mmol/L — ABNORMAL LOW (ref 22–32)
Calcium: 9.6 mg/dL (ref 8.9–10.3)
Chloride: 96 mmol/L — ABNORMAL LOW (ref 98–111)
Creatinine, Ser: 1.11 mg/dL (ref 0.61–1.24)
GFR, Estimated: >60 mL/min (ref >60)
Glucose, Bld: 169 mg/dL — ABNORMAL HIGH (ref 70–99)
Potassium: 3.1 mmol/L — ABNORMAL LOW (ref 3.5–5.1)
Sodium: 132 mmol/L — ABNORMAL LOW (ref 135–145)

## 2021-12-25 LAB — CBC WITH DIFFERENTIAL/PLATELET
Abs Immature Granulocytes: 0.1 10*3/uL — ABNORMAL HIGH (ref 0.00–0.07)
Basophils Absolute: 0 10*3/uL (ref 0.0–0.1)
Basophils Relative: 0 %
Eosinophils Absolute: 0 10*3/uL (ref 0.0–0.5)
Eosinophils Relative: 0 %
HCT: 42.3 % (ref 39.0–52.0)
Hemoglobin: 14.6 g/dL (ref 13.0–17.0)
Immature Granulocytes: 1 %
Lymphocytes Relative: 9 %
Lymphs Abs: 1.6 10*3/uL (ref 0.7–4.0)
MCH: 31.3 pg (ref 26.0–34.0)
MCHC: 34.5 g/dL (ref 30.0–36.0)
MCV: 90.8 fL (ref 80.0–100.0)
Monocytes Absolute: 1.6 10*3/uL — ABNORMAL HIGH (ref 0.1–1.0)
Monocytes Relative: 9 %
Neutro Abs: 14.1 10*3/uL — ABNORMAL HIGH (ref 1.7–7.7)
Neutrophils Relative %: 81 %
Platelets: 176 10*3/uL (ref 150–400)
RBC: 4.66 MIL/uL (ref 4.22–5.81)
RDW: 12.9 % (ref 11.5–15.5)
WBC: 17.4 10*3/uL — ABNORMAL HIGH (ref 4.0–10.5)
nRBC: 0 % (ref 0.0–0.2)

## 2021-12-25 LAB — CK: Total CK: 263 U/L (ref 49–397)

## 2021-12-25 NOTE — ED Triage Notes (Signed)
Pt brought in by [facility] after family called EMS for heroin overdose. Pt snorted heroin and then began to go out. Pt given 8mg  narcan by family. Pt arrives A&Ox4.

## 2021-12-25 NOTE — ED Provider Notes (Signed)
[facility] EMERGENCY DEPARTMENT Provider Note   CSN: [id number] Arrival date & time: 12/25/21  0502     History  Chief Complaint  Patient presents with   Drug Overdose    Eddie Carrillo is a 31 y.o. male.  Brought to the emergency department by ambulance for overdose.  Patient reports snorting heroin and then became somnolent.  Girlfriend administered 2 doses of Narcan.  Patient then became agitated, vomited.  Never went completely unconscious or stopped breathing.      Home Medications Prior to Admission medications   Medication Sig Start Date End Date Taking? Authorizing Provider  azithromycin (ZITHROMAX Z-PAK) 250 MG tablet 2 po day one, then 1 daily x 4 days 03/26/14   Bethann Berkshire, MD  ibuprofen (ADVIL,MOTRIN) 800 MG tablet Take 1 tablet (800 mg total) by mouth every 8 (eight) hours as needed for fever or mild pain. 03/26/14   Bethann Berkshire, MD  metoCLOPramide (REGLAN) 10 MG tablet Take 1 tablet (10 mg total) by mouth 3 (three) times daily with meals. 06/04/21 06/04/22  Elson Areas, PA-C  ondansetron (ZOFRAN ODT) 8 MG disintegrating tablet Take 1 tablet (8 mg total) by mouth every 8 (eight) hours as needed for nausea. 11/03/21   Derwood Kaplan, MD  OVER THE COUNTER MEDICATION Take 2 tablets by mouth every 4 (four) hours as needed (cold symptoms). OTC cold and flu medication.    [provider]  promethazine (PHENERGAN) 25 MG tablet Take 1 tablet (25 mg total) by mouth every 6 (six) hours as needed for nausea or vomiting. 03/26/14   Bethann Berkshire, MD      Allergies    Aspirin    Review of Systems   Review of Systems  Physical Exam Updated Vital Signs BP (!) 165/101    Pulse 98    Temp 98.6 F (37 C) (Oral)    Resp (!) 21    Ht 5\' 9"  (1.753 m)    Wt 83.9 kg    SpO2 98%    BMI 27.31 kg/m  Physical Exam  ED Results / Procedures / Treatments   Labs (all labs ordered are listed, but only abnormal results are displayed) Labs Reviewed  CBC WITH  DIFFERENTIAL/PLATELET - Abnormal; Notable for the following components:      Result Value   WBC 17.4 (*)    Neutro Abs 14.1 (*)    Monocytes Absolute 1.6 (*)    Abs Immature Granulocytes 0.10 (*)    All other components within normal limits  BASIC METABOLIC PANEL - Abnormal; Notable for the following components:   Sodium 132 (*)    Potassium 3.1 (*)    Chloride 96 (*)    CO2 18 (*)    Glucose, Bld 169 (*)    Anion gap 18 (*)    All other components within normal limits  CK    EKG None  Radiology No results found.  Procedures Procedures    Medications Ordered in ED Medications - No data to display  ED Course/ Medical Decision Making/ A&P                           Medical Decision Making  Patient presents with unintentional opiate overdose.  Patient given Narcan by significant other and presented in some distress from the Narcan but was awake and alert.  He was experiencing nausea, vomiting piloerection.  He was agitated at first but has been monitored and has done  well.  He is awake and alert, feeling better.  Basic labs are unremarkable.  Patient does not require further monitoring, will be discharged.        Final Clinical Impression(s) / ED Diagnoses Final diagnoses:  Opiate overdose, accidental or unintentional, initial encounter [facility])    Rx / DC Orders ED Discharge Orders     None         [place] Urieta, Canary Brim, MD 12/25/21 0725

## 2022-07-24 ENCOUNTER — Encounter (HOSPITAL_COMMUNITY): Payer: Self-pay | Admitting: Emergency Medicine

## 2022-07-24 ENCOUNTER — Emergency Department (HOSPITAL_COMMUNITY)
Admission: EM | Admit: 2022-07-24 | Discharge: 2022-07-24 | Disposition: A | Discharge status: Home / Self Care | Payer: Self-pay | Attending: Emergency Medicine | Admitting: Emergency Medicine

## 2022-07-24 ENCOUNTER — Other Ambulatory Visit: Payer: Self-pay

## 2022-07-24 DIAGNOSIS — E876 Hypokalemia: Secondary | ICD-10-CM | POA: Insufficient documentation

## 2022-07-24 DIAGNOSIS — R519 Headache, unspecified: Secondary | ICD-10-CM | POA: Insufficient documentation

## 2022-07-24 DIAGNOSIS — R110 Nausea: Secondary | ICD-10-CM

## 2022-07-24 DIAGNOSIS — R112 Nausea with vomiting, unspecified: Secondary | ICD-10-CM | POA: Insufficient documentation

## 2022-07-24 DIAGNOSIS — Z20822 Contact with and (suspected) exposure to covid-19: Secondary | ICD-10-CM | POA: Insufficient documentation

## 2022-07-24 DIAGNOSIS — R000 Tachycardia, unspecified: Secondary | ICD-10-CM | POA: Insufficient documentation

## 2022-07-24 HISTORY — DX: Poisoning by heroin, accidental (unintentional), initial encounter: T40.1X1A

## 2022-07-24 LAB — CBC WITH DIFFERENTIAL/PLATELET
Abs Immature Granulocytes: 0.03 10*3/uL (ref 0.00–0.07)
Basophils Absolute: 0 10*3/uL (ref 0.0–0.1)
Basophils Relative: 0 %
Eosinophils Absolute: 0.2 10*3/uL (ref 0.0–0.5)
Eosinophils Relative: 1 %
HCT: 38 % — ABNORMAL LOW (ref 39.0–52.0)
Hemoglobin: 12.9 g/dL — ABNORMAL LOW (ref 13.0–17.0)
Immature Granulocytes: 0 %
Lymphocytes Relative: 24 %
Lymphs Abs: 3.2 10*3/uL (ref 0.7–4.0)
MCH: 30.9 pg (ref 26.0–34.0)
MCHC: 33.9 g/dL (ref 30.0–36.0)
MCV: 91.1 fL (ref 80.0–100.0)
Monocytes Absolute: 0.8 10*3/uL (ref 0.1–1.0)
Monocytes Relative: 6 %
Neutro Abs: 8.9 10*3/uL — ABNORMAL HIGH (ref 1.7–7.7)
Neutrophils Relative %: 69 %
Platelets: 211 10*3/uL (ref 150–400)
RBC: 4.17 MIL/uL — ABNORMAL LOW (ref 4.22–5.81)
RDW: 13.4 % (ref 11.5–15.5)
WBC: 13.1 10*3/uL — ABNORMAL HIGH (ref 4.0–10.5)
nRBC: 0 % (ref 0.0–0.2)

## 2022-07-24 LAB — COMPREHENSIVE METABOLIC PANEL
ALT: 29 U/L (ref 0–44)
AST: 19 U/L (ref 15–41)
Albumin: 3.6 g/dL (ref 3.5–5.0)
Alkaline Phosphatase: 56 U/L (ref 38–126)
Anion gap: 8 (ref 5–15)
BUN: 6 mg/dL (ref 6–20)
CO2: 27 mmol/L (ref 22–32)
Calcium: 8.4 mg/dL — ABNORMAL LOW (ref 8.9–10.3)
Chloride: 100 mmol/L (ref 98–111)
Creatinine, Ser: 0.83 mg/dL (ref 0.61–1.24)
GFR, Estimated: >60 mL/min (ref >60)
Glucose, Bld: 146 mg/dL — ABNORMAL HIGH (ref 70–99)
Potassium: 2.4 mmol/L — CL (ref 3.5–5.1)
Sodium: 135 mmol/L (ref 135–145)
Total Bilirubin: 0.5 mg/dL (ref 0.3–1.2)
Total Protein: 6.7 g/dL (ref 6.5–8.1)

## 2022-07-24 LAB — MAGNESIUM: Magnesium: 1.8 mg/dL (ref 1.7–2.4)

## 2022-07-24 LAB — SARS CORONAVIRUS 2 BY RT PCR: SARS Coronavirus 2 by RT PCR: NEGATIVE

## 2022-07-24 LAB — LIPASE, BLOOD: Lipase: 23 U/L (ref 11–51)

## 2022-07-24 MED ORDER — METOCLOPRAMIDE HCL 5 MG/ML IJ SOLN
10.0000 mg | Freq: Once | INTRAMUSCULAR | Status: AC
  Administered 2022-07-24: 10 mg via INTRAVENOUS
  Filled 2022-07-24: qty 2

## 2022-07-24 MED ORDER — SODIUM CHLORIDE 0.9 % IV BOLUS
1000.0000 mL | Freq: Once | INTRAVENOUS | Status: AC
  Administered 2022-07-24: 1000 mL via INTRAVENOUS

## 2022-07-24 MED ORDER — POTASSIUM CHLORIDE CRYS ER 20 MEQ PO TBCR
40.0000 meq | EXTENDED_RELEASE_TABLET | Freq: Once | ORAL | Status: AC
Start: 2022-07-24 — End: 2022-07-24
  Administered 2022-07-24: 40 meq via ORAL
  Filled 2022-07-24: qty 2

## 2022-07-24 MED ORDER — POTASSIUM CHLORIDE CRYS ER 20 MEQ PO TBCR: 20.0000 meq | EXTENDED_RELEASE_TABLET | Freq: Two times a day (BID) | ORAL | 0 refills | Status: AC

## 2022-07-24 MED ORDER — POTASSIUM CHLORIDE 10 MEQ/100ML IV SOLN
10.0000 meq | INTRAVENOUS | Status: DC
  Administered 2022-07-24: 10 meq via INTRAVENOUS
  Filled 2022-07-24: qty 100

## 2022-07-24 MED ORDER — ONDANSETRON HCL 4 MG/2ML IJ SOLN
4.0000 mg | Freq: Once | INTRAMUSCULAR | Status: AC
  Administered 2022-07-24: 4 mg via INTRAVENOUS
  Filled 2022-07-24: qty 2

## 2022-07-24 MED ORDER — DIPHENHYDRAMINE HCL 50 MG/ML IJ SOLN
25.0000 mg | Freq: Once | INTRAMUSCULAR | Status: AC
  Administered 2022-07-24: 25 mg via INTRAVENOUS
  Filled 2022-07-24: qty 1

## 2022-07-24 MED ORDER — POTASSIUM CHLORIDE CRYS ER 20 MEQ PO TBCR
40.0000 meq | EXTENDED_RELEASE_TABLET | Freq: Once | ORAL | Status: AC
  Administered 2022-07-24: 40 meq via ORAL
  Filled 2022-07-24: qty 2

## 2022-07-24 NOTE — ED Triage Notes (Signed)
Pt bib EMS with c/o dizziness, nausea, and headache. States he hasn't eaten in 2 days.

## 2022-07-24 NOTE — ED Notes (Signed)
Pt called nurse to room, c/o IV burning, pt ripped out IV before nurse had a chance to intervene. Pt says he feels better and does not want another IV. Dr Blinda Leatherwood made aware.

## 2022-07-24 NOTE — ED Provider Notes (Signed)
[facility] EMERGENCY DEPARTMENT Provider Note   CSN: [id number] Arrival date & time: 07/24/22  6811     History  Chief Complaint  Patient presents with   Nausea    Eddie Carrillo is a 31 y.o. male.  Patient presents to the emergency department for evaluation of headache, nausea and vomiting.  Patient reports symptoms ongoing for couple of days, has not been able to eat much because of the nausea.  He did vomit earlier tonight.       Home Medications Prior to Admission medications   Medication Sig Start Date End Date Taking? Authorizing Provider  azithromycin (ZITHROMAX Z-PAK) 250 MG tablet 2 po day one, then 1 daily x 4 days 03/26/14   Bethann Berkshire, MD  ibuprofen (ADVIL,MOTRIN) 800 MG tablet Take 1 tablet (800 mg total) by mouth every 8 (eight) hours as needed for fever or mild pain. 03/26/14   Bethann Berkshire, MD  metoCLOPramide (REGLAN) 10 MG tablet Take 1 tablet (10 mg total) by mouth 3 (three) times daily with meals. 06/04/21 06/04/22  Elson Areas, PA-C  ondansetron (ZOFRAN ODT) 8 MG disintegrating tablet Take 1 tablet (8 mg total) by mouth every 8 (eight) hours as needed for nausea. 11/03/21   Derwood Kaplan, MD  OVER THE COUNTER MEDICATION Take 2 tablets by mouth every 4 (four) hours as needed (cold symptoms). OTC cold and flu medication.    [provider]  promethazine (PHENERGAN) 25 MG tablet Take 1 tablet (25 mg total) by mouth every 6 (six) hours as needed for nausea or vomiting. 03/26/14   Bethann Berkshire, MD      Allergies    Aspirin    Review of Systems   Review of Systems  Physical Exam Updated Vital Signs BP (!) 162/105   Pulse (!) 119   Temp 98.1 F (36.7 C) (Oral)   Resp 15   Ht 5\' 9"  (1.753 m)   Wt 84 kg   SpO2 100%   BMI 27.35 kg/m  Physical Exam Vitals and nursing note reviewed.  Constitutional:      General: He is not in acute distress.    Appearance: He is well-developed.  HENT:     Head: Normocephalic and atraumatic.      Mouth/Throat:     Mouth: Mucous membranes are moist.  Eyes:     General: Vision grossly intact. Gaze aligned appropriately.     Extraocular Movements: Extraocular movements intact.     Conjunctiva/sclera: Conjunctivae normal.  Cardiovascular:     Rate and Rhythm: Regular rhythm. Tachycardia present.     Pulses: Normal pulses.     Heart sounds: Normal heart sounds, S1 normal and S2 normal. No murmur heard.    No friction rub. No gallop.  Pulmonary:     Effort: Pulmonary effort is normal. No respiratory distress.     Breath sounds: Normal breath sounds.  Abdominal:     Palpations: Abdomen is soft.     Tenderness: There is no abdominal tenderness. There is no guarding or rebound.     Hernia: No hernia is present.  Musculoskeletal:        General: No swelling.     Cervical back: Full passive range of motion without pain, normal range of motion and neck supple. No pain with movement, spinous process tenderness or muscular tenderness. Normal range of motion.     Right lower leg: No edema.     Left lower leg: No edema.  Skin:    General:  Skin is warm and dry.     Capillary Refill: Capillary refill takes less than 2 seconds.     Findings: No ecchymosis, erythema, lesion or wound.  Neurological:     Mental Status: He is alert and oriented to person, place, and time.     GCS: GCS eye subscore is 4. GCS verbal subscore is 5. GCS motor subscore is 6.     Cranial Nerves: Cranial nerves 2-12 are intact.     Sensory: Sensation is intact.     Motor: Motor function is intact. No weakness or abnormal muscle tone.     Coordination: Coordination is intact.  Psychiatric:        Mood and Affect: Mood normal.        Speech: Speech normal.        Behavior: Behavior normal.     ED Results / Procedures / Treatments   Labs (all labs ordered are listed, but only abnormal results are displayed) Labs Reviewed  CBC WITH DIFFERENTIAL/PLATELET - Abnormal; Notable for the following components:       Result Value   WBC 13.1 (*)    RBC 4.17 (*)    Hemoglobin 12.9 (*)    HCT 38.0 (*)    Neutro Abs 8.9 (*)    All other components within normal limits  COMPREHENSIVE METABOLIC PANEL - Abnormal; Notable for the following components:   Potassium 2.4 (*)    Glucose, Bld 146 (*)    Calcium 8.4 (*)    All other components within normal limits  SARS CORONAVIRUS 2 BY RT PCR  LIPASE, BLOOD  MAGNESIUM    EKG EKG Interpretation  Date/Time:  Friday July 24 2022 03:09:07 EDT Ventricular Rate:  115 PR Interval:  148 QRS Duration: 84 QT Interval:  341 QTC Calculation: 472 R Axis:   74 Text Interpretation: Sinus tachycardia Borderline prolonged QT interval Confirmed by Gilda Crease [phone]) on 07/24/2022 3:13:56 AM  Radiology No results found.  Procedures Procedures    Medications Ordered in ED Medications  potassium chloride 10 mEq in 100 mL IVPB (10 mEq Intravenous New Bag/Given 07/24/22 0417)  sodium chloride 0.9 % bolus 1,000 mL (1,000 mLs Intravenous New Bag/Given 07/24/22 0330)  metoCLOPramide (REGLAN) injection 10 mg (10 mg Intravenous Given 07/24/22 0328)  diphenhydrAMINE (BENADRYL) injection 25 mg (25 mg Intravenous Given 07/24/22 0327)  ondansetron (ZOFRAN) injection 4 mg (4 mg Intravenous Given 07/24/22 0327)  potassium chloride SA (KLOR-CON M) CR tablet 40 mEq (40 mEq Oral Given 07/24/22 0416)    ED Course/ Medical Decision Making/ A&P                           Medical Decision Making Amount and/or Complexity of Data Reviewed Labs: ordered.  Risk Prescription drug management.   Patient presents to the emergency department for evaluation of headache, nausea, vomiting, dizziness.  Symptoms ongoing for a couple of days.  He does not have any focal neurologic symptoms or findings on examination.  He appears well.  No meningismus, is afebrile.  Patient treated with IV fluids, Reglan and his nausea and headache have resolved.  He is found to have low potassium, given IV  and oral potassium.  Patient feels better, appears well, does not require further work-up.  Discharged with prescription for potassium, given return precautions.        Final Clinical Impression(s) / ED Diagnoses Final diagnoses:  Nausea  Bad headache  Hypokalemia  Rx / DC Orders ED Discharge Orders     None         Faven Watterson, Canary Brim, MD 07/24/22 [phone]

## 2022-08-10 ENCOUNTER — Other Ambulatory Visit: Payer: Self-pay

## 2022-08-10 ENCOUNTER — Encounter (HOSPITAL_COMMUNITY): Payer: Self-pay | Admitting: Emergency Medicine

## 2022-08-10 DIAGNOSIS — R319 Hematuria, unspecified: Secondary | ICD-10-CM | POA: Insufficient documentation

## 2022-08-10 DIAGNOSIS — Z5321 Procedure and treatment not carried out due to patient leaving prior to being seen by health care provider: Secondary | ICD-10-CM | POA: Insufficient documentation

## 2022-08-10 DIAGNOSIS — I10 Essential (primary) hypertension: Secondary | ICD-10-CM | POA: Insufficient documentation

## 2022-08-10 NOTE — ED Triage Notes (Signed)
Pt c/o urinating blood x 2 days. Pt is hypertensive in triage and states he does not take his bp meds.

## 2022-08-11 ENCOUNTER — Emergency Department (HOSPITAL_COMMUNITY)
Admission: EM | Admit: 2022-08-11 | Discharge: 2022-08-11 | Discharge status: Against Medical Advice | Payer: Self-pay | Attending: Emergency Medicine | Admitting: Emergency Medicine

## 2022-08-11 ENCOUNTER — Encounter (HOSPITAL_COMMUNITY): Payer: Self-pay | Admitting: Emergency Medicine

## 2022-08-11 ENCOUNTER — Emergency Department (HOSPITAL_COMMUNITY)
Admission: EM | Admit: 2022-08-11 | Discharge: 2022-08-11 | Disposition: A | Discharge status: Home / Self Care | Payer: Self-pay | Attending: Emergency Medicine | Admitting: Emergency Medicine

## 2022-08-11 ENCOUNTER — Other Ambulatory Visit: Payer: Self-pay

## 2022-08-11 ENCOUNTER — Emergency Department (HOSPITAL_COMMUNITY): Payer: Self-pay

## 2022-08-11 DIAGNOSIS — R000 Tachycardia, unspecified: Secondary | ICD-10-CM | POA: Insufficient documentation

## 2022-08-11 DIAGNOSIS — R109 Unspecified abdominal pain: Secondary | ICD-10-CM | POA: Insufficient documentation

## 2022-08-11 DIAGNOSIS — R319 Hematuria, unspecified: Secondary | ICD-10-CM | POA: Insufficient documentation

## 2022-08-11 LAB — URINALYSIS, ROUTINE W REFLEX MICROSCOPIC
Bacteria, UA: NONE SEEN
Bilirubin Urine: NEGATIVE
Glucose, UA: NEGATIVE mg/dL
Hgb urine dipstick: NEGATIVE
Ketones, ur: 5 mg/dL — AB
Nitrite: NEGATIVE
Protein, ur: 30 mg/dL — AB
Specific Gravity, Urine: 1.03 (ref 1.005–1.030)
pH: 5 (ref 5.0–8.0)

## 2022-08-11 NOTE — ED Triage Notes (Signed)
Pt states he has blood in urine. Pt has not been taking blood pressure meds.

## 2022-08-11 NOTE — ED Provider Notes (Signed)
Emergency Department Provider Note   I have reviewed the triage vital signs and the nursing notes.   HISTORY  Chief Complaint Hematuria   HPI Eddie Carrillo is a 31 y.o. male with a past history of substance abuse presents emergency department with left flank discomfort and perceived blood in the urine.  Symptoms have been ongoing for the past several days.  Patient describes a mild, persistent discomfort in the left abdomen and flank.  No blood in the bowel movements.  No vomiting.  Denies prior history of kidney stone.  No urethral discharge or concern for STI. Patient is not experiencing retention symptoms.    Past Medical History:  Diagnosis Date   Heroin overdose, accidental or unintentional, initial encounter (HCC)    Substance abuse (HCC)     Review of Systems  Constitutional: No fever/chills Eyes: No visual changes. ENT: No sore throat. Cardiovascular: Denies chest pain. Respiratory: Denies shortness of breath. Gastrointestinal: Positive left flank/abdominal pain.  No nausea, no vomiting.  No diarrhea.  No constipation. Genitourinary: Negative for dysuria. Positive hematuria. No retention symptoms.  Musculoskeletal: Negative for back pain. Skin: Negative for rash. Neurological: Negative for headaches, focal weakness or numbness.   ____________________________________________   PHYSICAL EXAM:  VITAL SIGNS: ED Triage Vitals  Enc Vitals Group     BP 08/11/22 0254 (!) 171/154     Pulse Rate 08/11/22 0254 (!) 104     Resp 08/11/22 0316 16     Temp 08/11/22 0254 98.3 F (36.8 C)     Temp Source 08/11/22 0254 Oral     SpO2 08/11/22 0254 96 %     Weight 08/11/22 0254 187 lb 6.3 oz (85 kg)     Height 08/11/22 0254 5\' 9"  (1.753 m)   Constitutional: Alert and oriented. Well appearing and in no acute distress. Eyes: Conjunctivae are normal. Head: Atraumatic. Nose: No congestion/rhinnorhea. Mouth/Throat: Mucous membranes are moist.  Neck: No stridor.    Cardiovascular: Normal rate, regular rhythm. Good peripheral circulation. Grossly normal heart sounds.   Respiratory: Normal respiratory effort.  No retractions. Lungs CTAB. Gastrointestinal: Soft and nontender. No distention. No CVA tenderness.  Musculoskeletal: No lower extremity tenderness nor edema. No gross deformities of extremities. Neurologic:  Normal speech and language. No gross focal neurologic deficits are appreciated.  Skin:  Skin is warm, dry and intact. No rash noted.  ____________________________________________   LABS (all labs ordered are listed, but only abnormal results are displayed)  Labs Reviewed  URINE CULTURE  URINALYSIS, ROUTINE W REFLEX MICROSCOPIC  GC/CHLAMYDIA PROBE AMP ([place]) NOT AT [facility]   ____________________________________________  RADIOLOGY  No results found.  ____________________________________________   PROCEDURES  Procedure(s) performed:   Procedures   ____________________________________________   INITIAL IMPRESSION / ASSESSMENT AND PLAN / ED COURSE  Pertinent labs & imaging results that were available during my care of the patient were reviewed by me and considered in my medical decision making (see chart for details).   This patient is Presenting for Evaluation of abdominal pain, which does require a range of treatment options, and is a complaint that involves a high risk of morbidity and mortality.  The Differential Diagnoses includes but is not exclusive to acute appendicitis, renal colic, testicular torsion, urinary tract infection, prostatitis,  diverticulitis, small bowel obstruction, colitis, abdominal aortic aneurysm, gastroenteritis, constipation etc.   Critical Interventions-    Medications - No data to display  Reassessment after intervention:     I did obtain Additional Historical Information from  wife at bedside.  I decided to review pertinent External Data, and in summary last ED visit was 07/24/22 for  an unrelated issue.   Clinical Laboratory Tests Ordered, included ***  Radiologic Tests Ordered, included CT renal. I independently interpreted the images and agree with radiology interpretation.   Cardiac Monitor Tracing which shows tachycardia.   Social Determinants of Health Risk positive drug abuse history.   Consult complete with  Medical Decision Making: Summary:  Patient presents emergency department with blood in the urine and left flank pain.  No focal tenderness on abdominal exam and patient overall appears comfortable.  Plan for CT renal along with UA/culture.  Have added on gonorrhea/chlamydia testing by urine as well.  Reevaluation with update and discussion with   ***Considered admission***  Disposition:   ____________________________________________  FINAL CLINICAL IMPRESSION(S) / ED DIAGNOSES  Final diagnoses:  None     NEW OUTPATIENT MEDICATIONS STARTED DURING THIS VISIT:  New Prescriptions   No medications on file    Note:  This document was prepared using Dragon voice recognition software and may include unintentional dictation errors.  Alona Bene, MD, [facility] Emergency Medicine

## 2022-08-11 NOTE — Discharge Instructions (Signed)

## 2022-08-11 NOTE — ED Notes (Signed)
Unable to obtain updated vs. Pt walking around in room. Ambulatory to lobby with significant other

## 2022-08-11 NOTE — ED Notes (Signed)
Patient transported to CT 

## 2022-08-12 LAB — GC/CHLAMYDIA PROBE AMP (~~LOC~~) NOT AT ARMC
Chlamydia: NEGATIVE
Comment: NEGATIVE
Comment: NORMAL
Neisseria Gonorrhea: NEGATIVE

## 2022-08-12 LAB — URINE CULTURE

## 2022-12-23 ENCOUNTER — Ambulatory Visit: Admission: EM | Admit: 2022-12-23 | Discharge: 2022-12-23 | Discharge status: Against Medical Advice | Payer: Self-pay

## 2022-12-23 NOTE — ED Notes (Signed)
Pt got aggressive when told he could not come back to get std tested with his wife.

## 2023-03-09 IMAGING — CT CT HEAD W/O CM
3 of 7 series · 14 of 47 positions shown, 16 images · non-contrast
Comparison: None.

CLINICAL DATA: Intracranial pressure elevation suspected

EXAM:
CT HEAD WITHOUT CONTRAST
TECHNIQUE: Contiguous axial images were obtained from the base of the skull
through the vertex without intravenous contrast.

[Series 4: coronal soft · coronal · 0.37mm/px · 3 of 84 slices shown]
[im 21/84  brain]
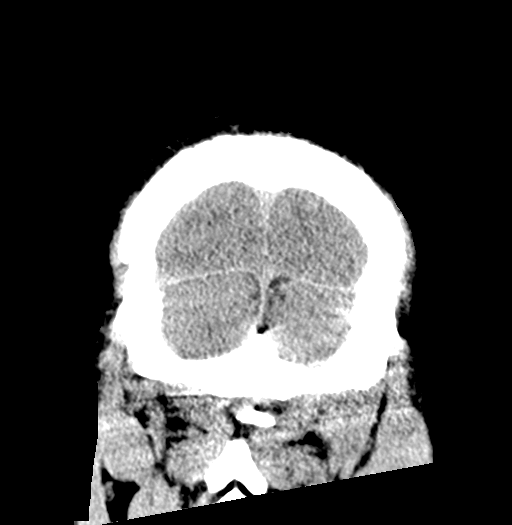
[im 42/84  brain]
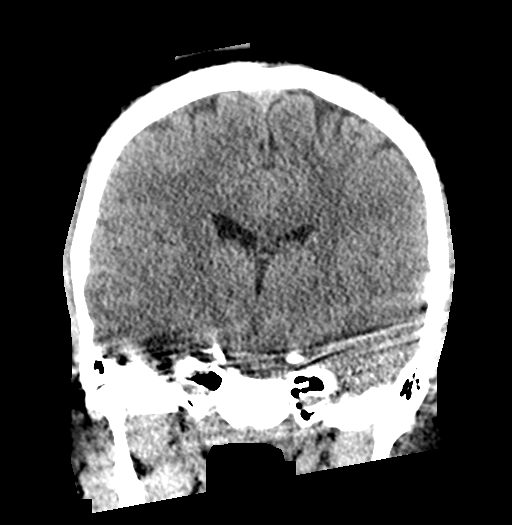
[im 63/84  brain]
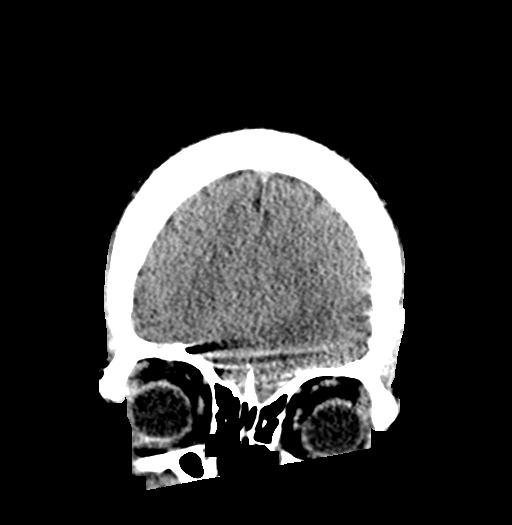

[Series 5: sagittal soft · sagittal · 0.36mm/px · 3 of 61 slices shown]
[im 6/61  brain]
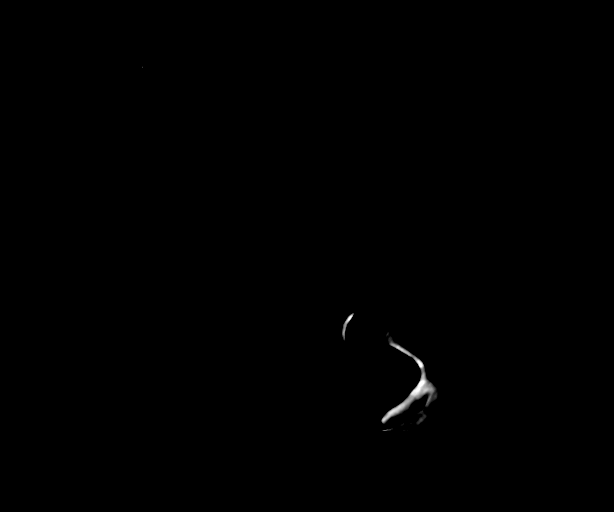
[im 11/61  brain]
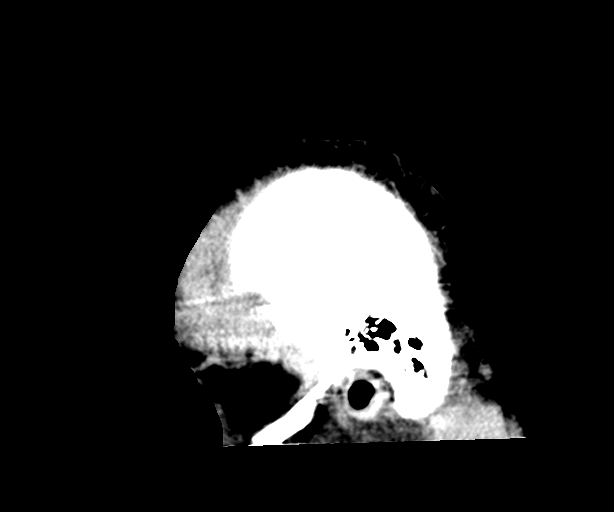
[im 17/61  brain]
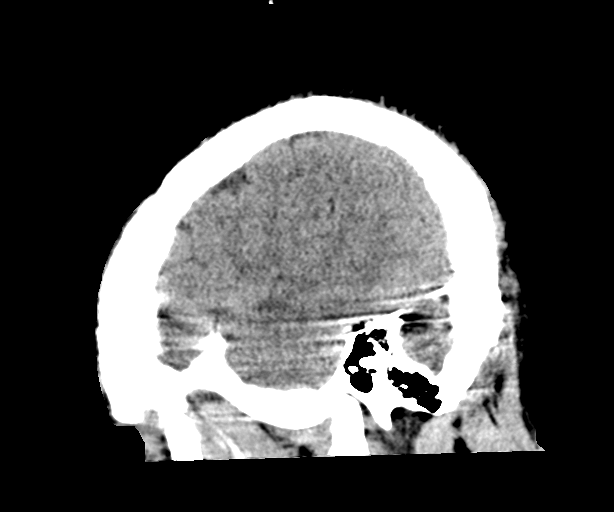

[Series 10: head ax w o · axial · 0.32mm/px · z∈[-80,+65]mm · 8 of 37 slices shown, 10 images]
[im 4/37  brain]
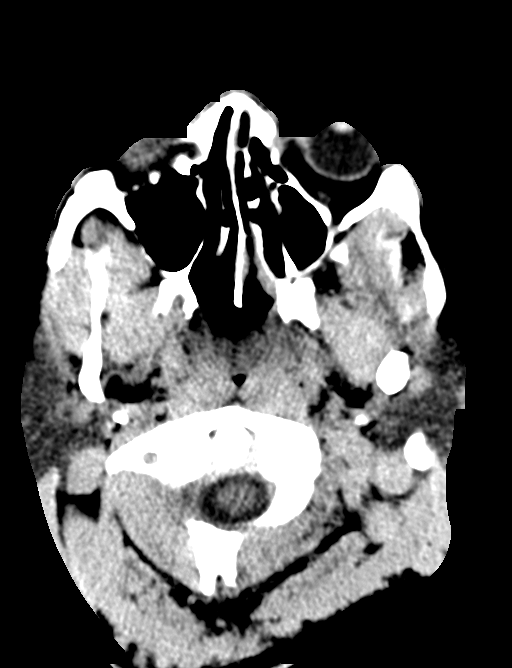
[im 4/37  bone]
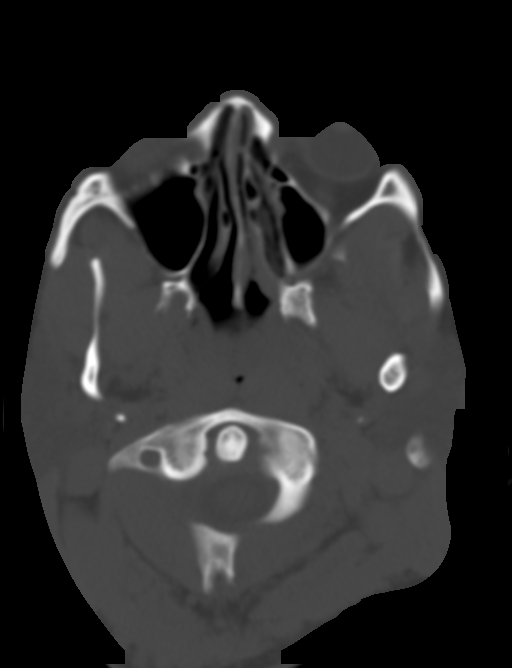
[im 10/37  brain]
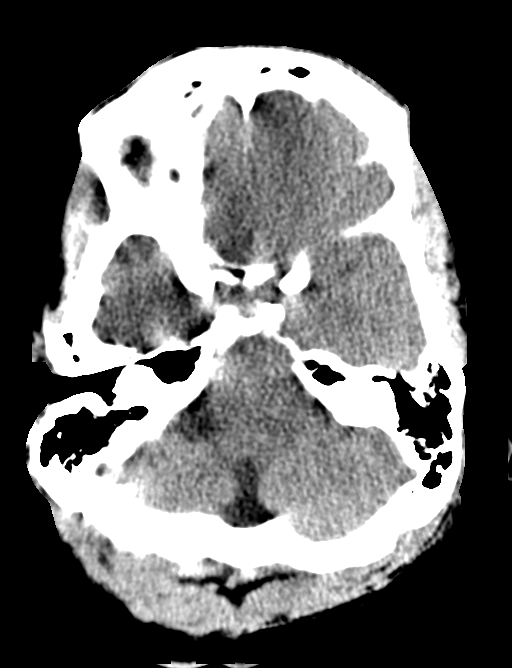
[im 13/37  brain]
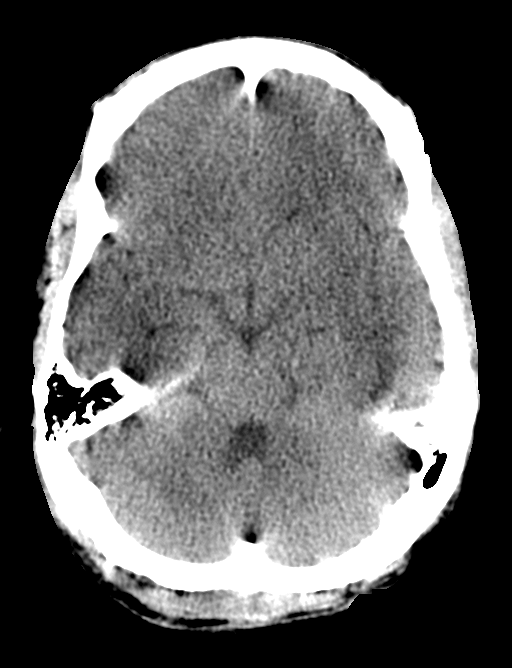
[im 16/37  brain]
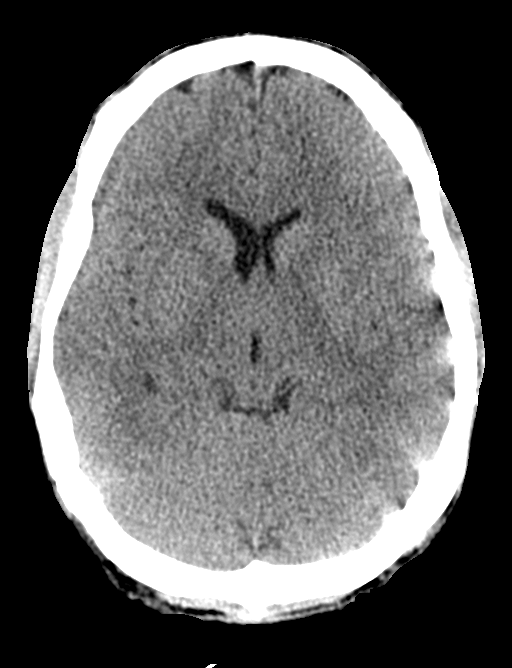
[im 22/37  brain]
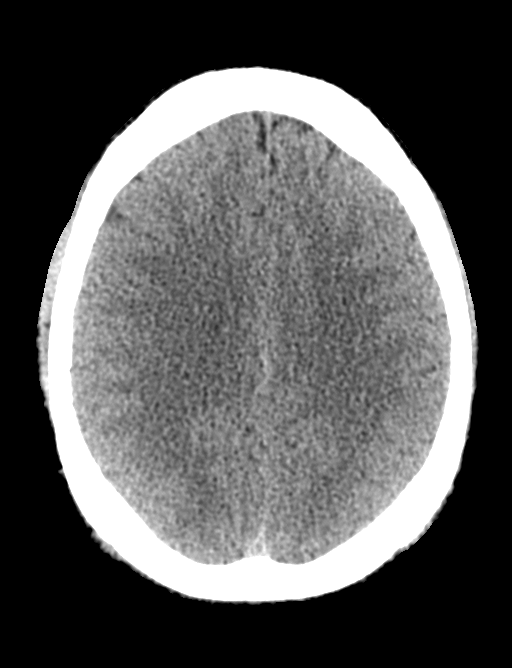
[im 22/37  bone]
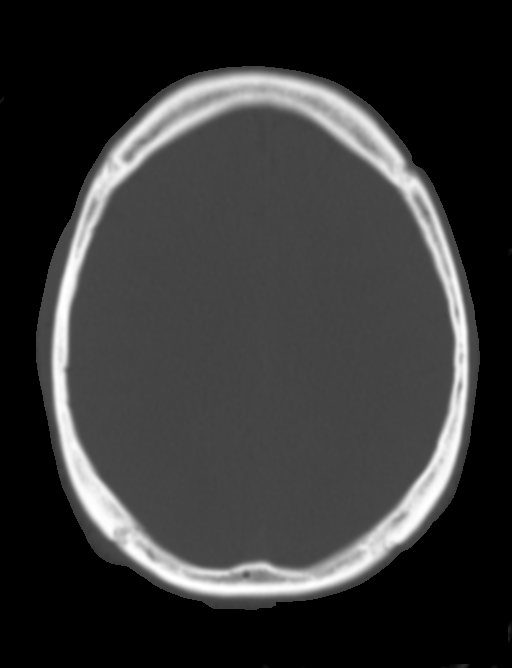
[im 25/37  brain]
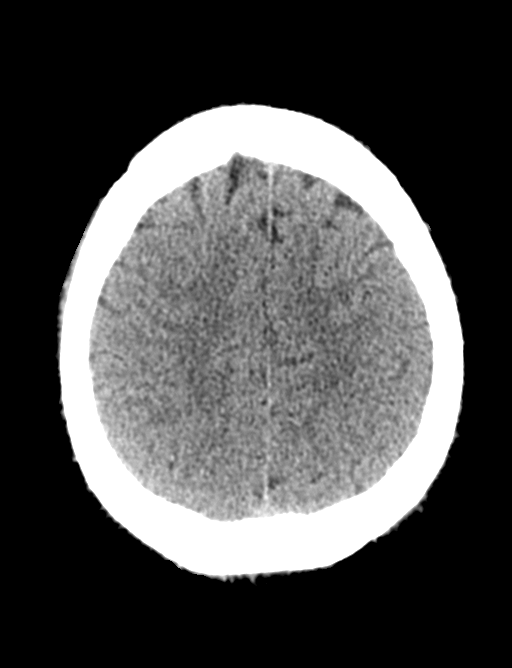
[im 28/37  brain]
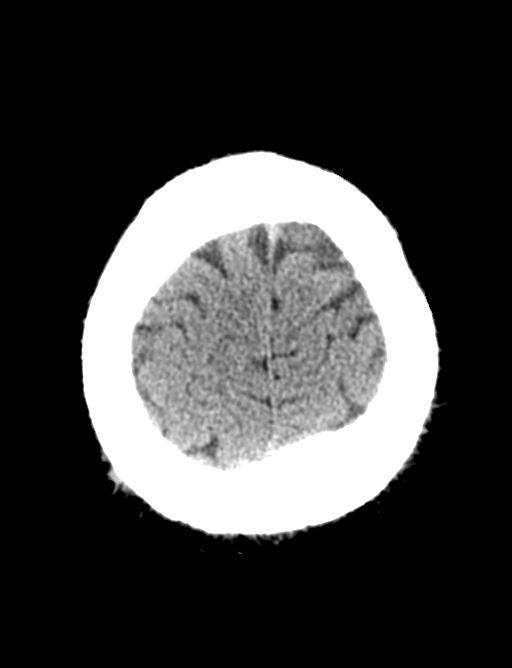
[im 34/37  brain]
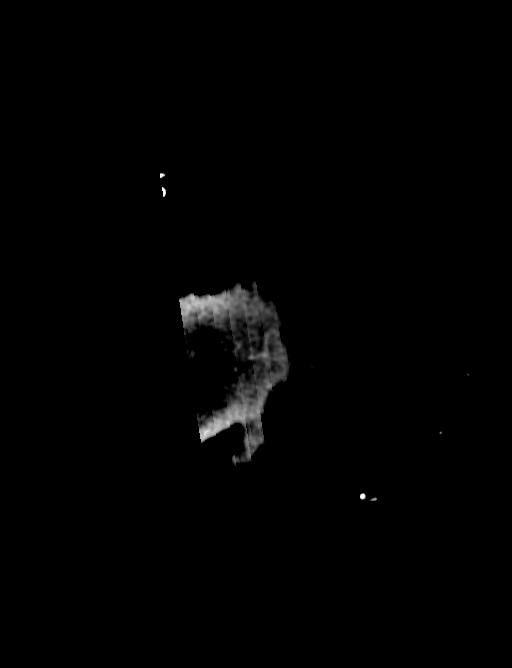

[14 of 47 positions shown; findings below may reference images not displayed]

FINDINGS: Brain: Motion degraded exam shows no evidence of an acute
hemorrhage, hydrocephalus, mass lesion, or abnormal extra-axial
fluid collection.

Vascular: No hyperdense vessel or unexpected calcification.

Skull: No evidence for fracture. No worrisome lytic or sclerotic
lesion.

Sinuses/Orbits: The visualized paranasal sinuses and mastoid air
cells are clear. Visualized portions of the globes and intraorbital
fat are unremarkable.

Other: None.
IMPRESSION: Unremarkable.  No acute intracranial abnormality.

## 2023-03-09 IMAGING — CT CT ANGIO CHEST-ABD-PELV FOR DISSECTION W/ AND WO/W CM
2 of 7 series · 10 of 36 positions shown, 15 images · IV contrast (Omnipaque or Isovue)
Comparison: None.

CLINICAL DATA: Abdominal pain, aortic dissection suspected possible
overdose

EXAM:
CT ANGIOGRAPHY CHEST, ABDOMEN AND PELVIS
TECHNIQUE: Non-contrast CT of the chest was initially obtained.

[Series 15: axial arterial · axial · arterial · 0.69mm/px · z∈[+97,+691]mm · 9 of 234 slices shown, 13 images]
[im 18/234  mediastinal]
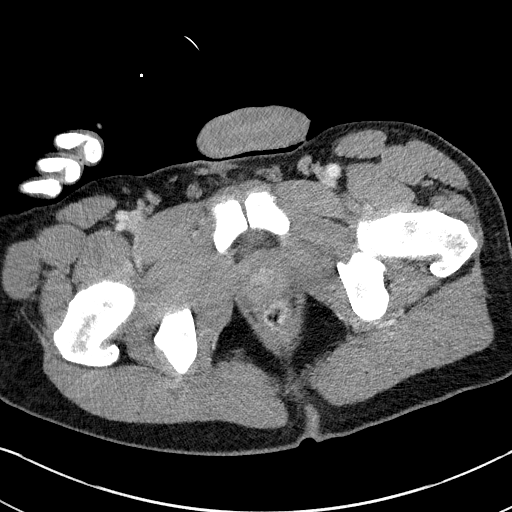
[im 18/234  bone]
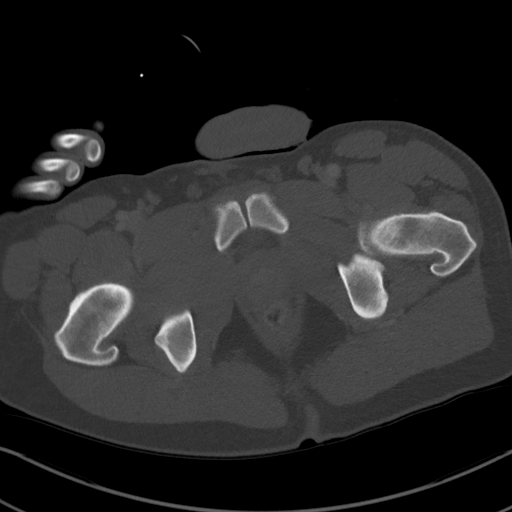
[im 54/234  mediastinal]
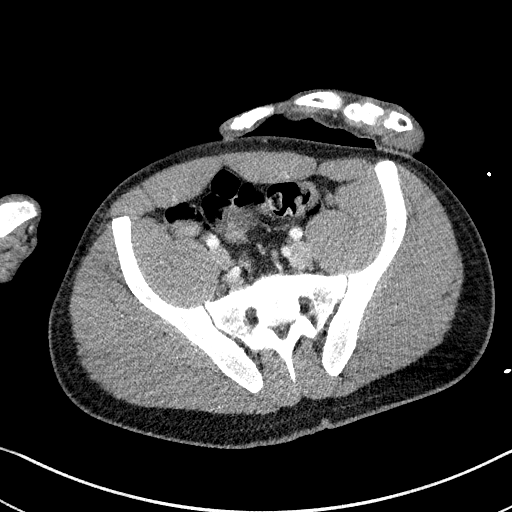
[im 72/234  mediastinal]
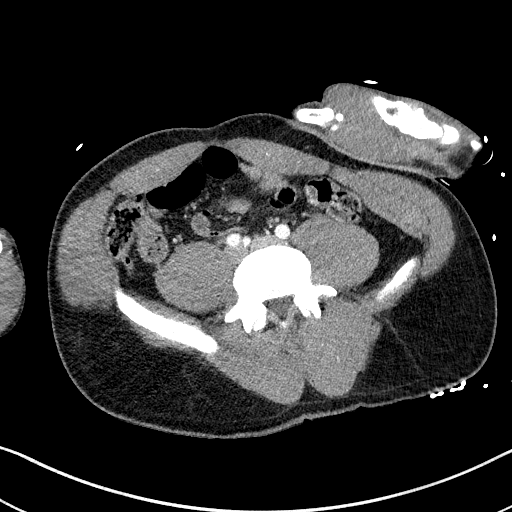
[im 108/234  mediastinal]
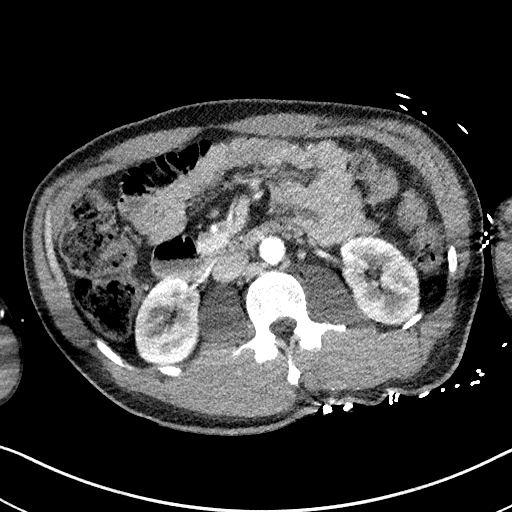
[im 126/234  mediastinal]
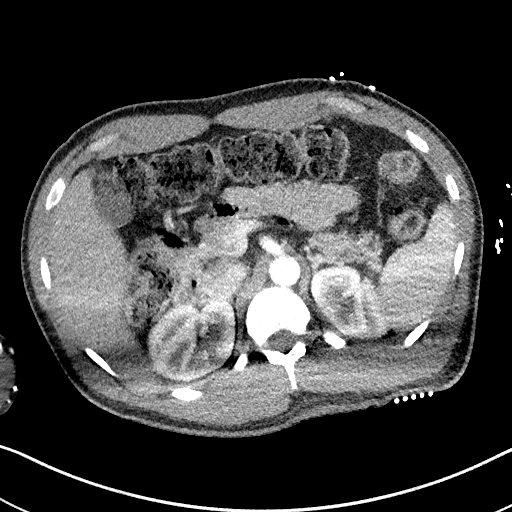
[im 162/234  mediastinal]
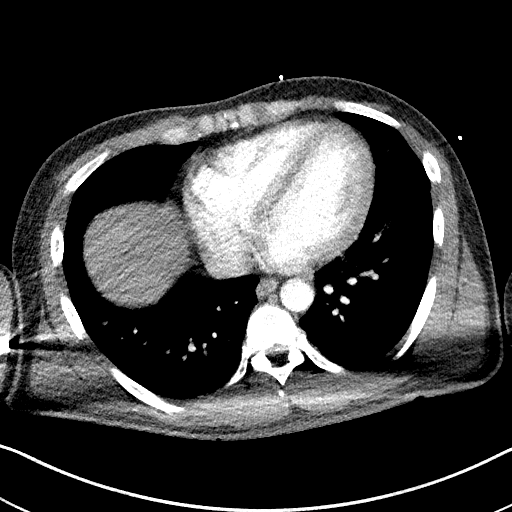
[im 162/234  lung]
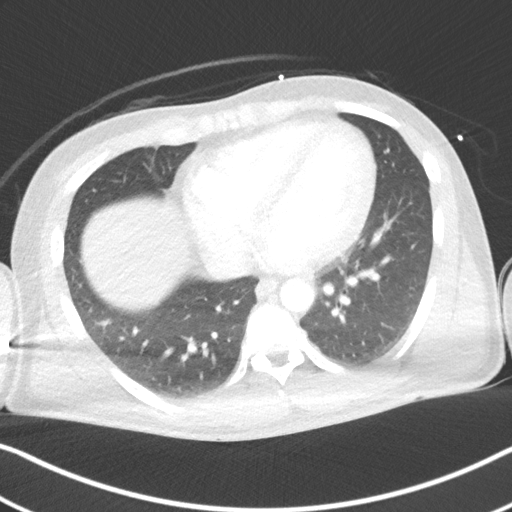
[im 180/234  mediastinal]
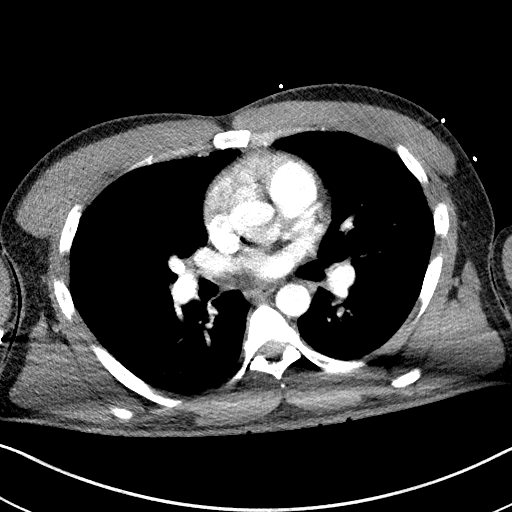
[im 180/234  lung]
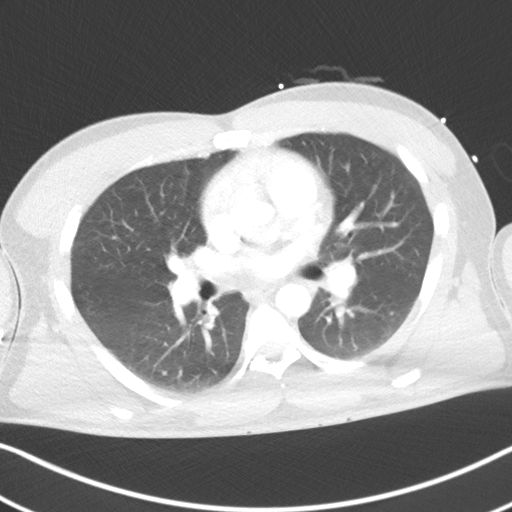
[im 198/234  lung]
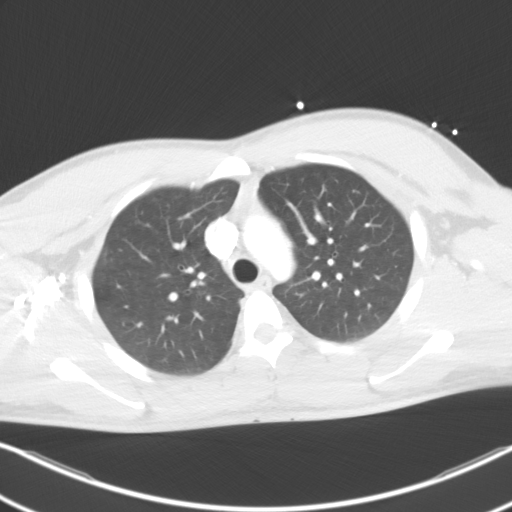
[im 216/234  mediastinal]
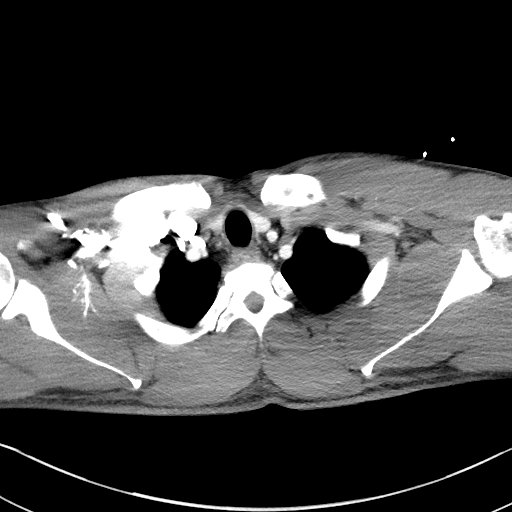
[im 216/234  lung]
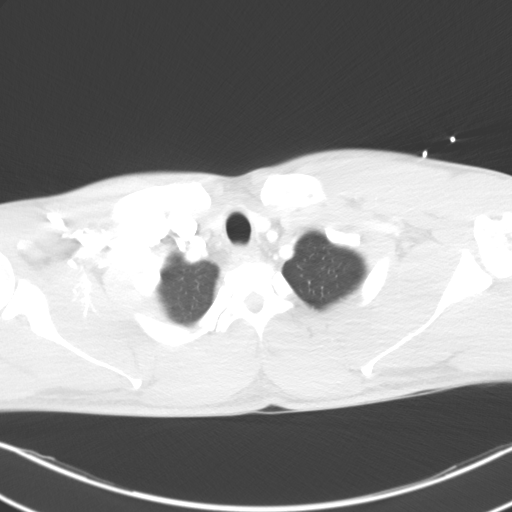

[Series 19: cor soft · coronal · 0.72mm/px · 1 of 124 slices shown, 2 images]
[im 62/124  mediastinal]
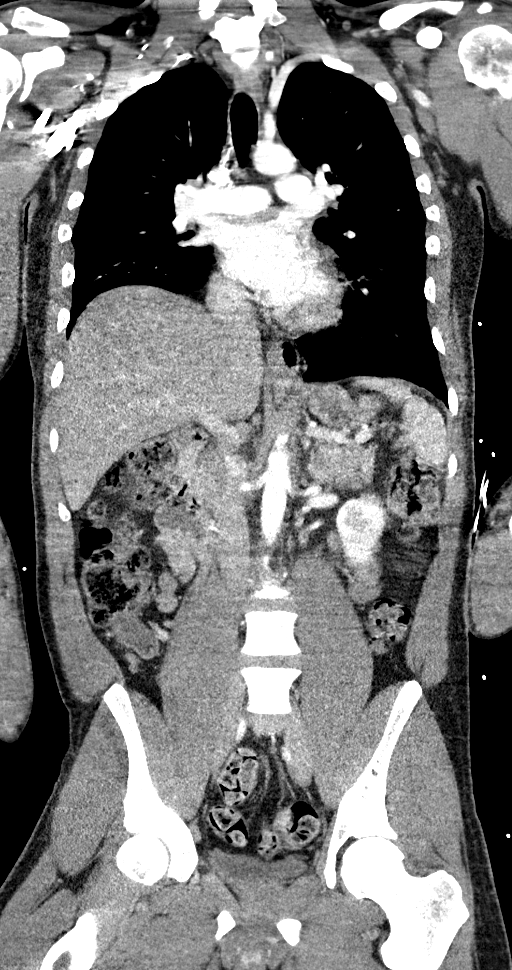
[im 62/124  bone]
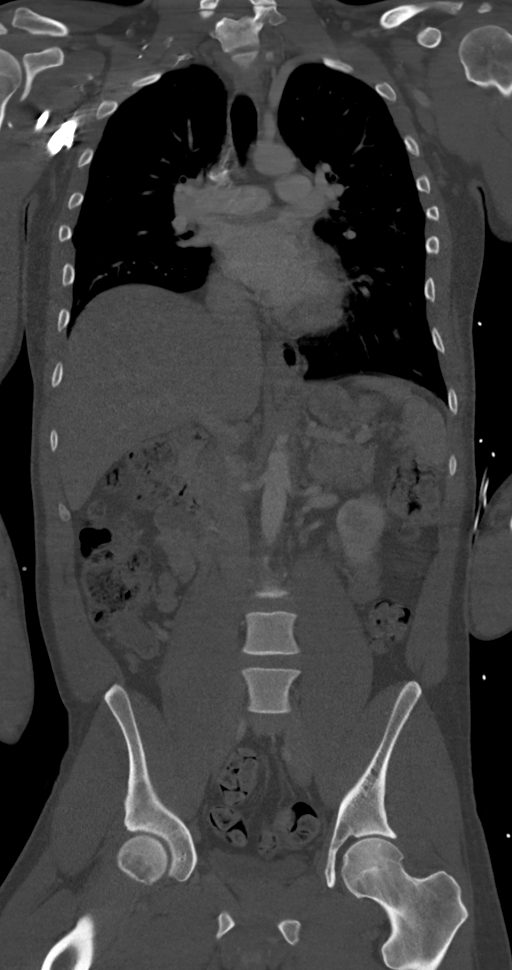

[10 of 36 positions shown; findings below may reference images not displayed]

Multidetector CT imaging through the chest, abdomen and pelvis was
performed using the standard protocol during bolus administration of
intravenous contrast. Multiplanar reconstructed images and MIPs were
obtained and reviewed to evaluate the vascular anatomy.

CONTRAST:  100mL OMNIPAQUE IOHEXOL 350 MG/ML SOLN
FINDINGS: CTA CHEST FINDINGS

Cardiovascular: Preferential opacification of the thoracic aorta. No
evidence of aortic aneurysm dissection other acute aortic. Normal
heart size. No pericardial effusion.

Mediastinum/Nodes: No enlarged mediastinal, hilar, or axillary lymph
nodes. Thymic remnant in the anterior mediastinum. Small hiatal
hernia. Thyroid gland, trachea, and esophagus demonstrate no
significant findings.

Lungs/Pleura: Lungs are clear. No pleural effusion or pneumothorax.

Musculoskeletal: No chest wall abnormality. No acute or significant
osseous findings.

Review of the MIP images confirms the above findings.

CTA ABDOMEN AND PELVIS FINDINGS

VASCULAR

Normal contour and caliber of the abdominal aorta. No evidence of
aneurysm, dissection, or other acute aortic pathology. No
significant atherosclerosis. Duplicated bilateral renal arteries
with small accessory inferior pole renal arteries. Otherwise
standard branching pattern of the abdominal aorta.

Review of the MIP images confirms the above findings.

NON-VASCULAR

Hepatobiliary: No solid liver abnormality is seen. No gallstones,
gallbladder wall thickening, or biliary dilatation.

Pancreas: Unremarkable. No pancreatic ductal dilatation or
surrounding inflammatory changes.

Spleen: Normal in size without significant abnormality.

Adrenals/Urinary Tract: Adrenal glands are unremarkable. Kidneys are
normal, without renal calculi, solid lesion, or hydronephrosis.
Bladder is unremarkable.

Stomach/Bowel: Stomach is within normal limits. Appendix appears
normal. No evidence of bowel wall thickening, distention, or
inflammatory changes. Large burden of stool and stool balls
throughout rectum.

Lymphatic: No enlarged abdominal or pelvic lymph nodes.

Reproductive: No mass or other significant abnormality.

Other: No abdominal wall hernia or abnormality. No abdominopelvic
ascites.

Musculoskeletal: No acute or significant osseous findings.

Review of the MIP images confirms the above findings.
IMPRESSION: Normal contour and caliber of the thoracic and abdominal aorta. No
evidence of aneurysm, dissection, or other acute aortic pathology.
No significant atherosclerosis.
